# Patient Record
Sex: Female | Born: 1980
Health system: Southern US, Community
[De-identification: ages and names within clinical notes are randomized; demographics above are authoritative.]

## PROBLEM LIST (undated history)

## (undated) DIAGNOSIS — L80 Vitiligo: Secondary | ICD-10-CM

## (undated) DIAGNOSIS — N946 Dysmenorrhea, unspecified: Secondary | ICD-10-CM

## (undated) DIAGNOSIS — M549 Dorsalgia, unspecified: Secondary | ICD-10-CM

## (undated) DIAGNOSIS — M5430 Sciatica, unspecified side: Secondary | ICD-10-CM

## (undated) DIAGNOSIS — F319 Bipolar disorder, unspecified: Secondary | ICD-10-CM

## (undated) DIAGNOSIS — I10 Essential (primary) hypertension: Secondary | ICD-10-CM

## (undated) HISTORY — DX: Essential (primary) hypertension: I10

## (undated) HISTORY — PX: TUBAL LIGATION: SHX77

## (undated) HISTORY — DX: Bipolar disorder, unspecified: F31.9

## (undated) HISTORY — DX: Dorsalgia, unspecified: M54.9

## (undated) HISTORY — DX: Dysmenorrhea, unspecified: N94.6

## (undated) HISTORY — DX: Vitiligo: L80

## (undated) HISTORY — DX: Sciatica, unspecified side: M54.30

---

## 2004-11-13 ENCOUNTER — Emergency Department: Payer: Self-pay | Admitting: Emergency Medicine

## 2005-06-13 ENCOUNTER — Observation Stay: Payer: Self-pay

## 2005-07-30 ENCOUNTER — Inpatient Hospital Stay: Payer: Self-pay

## 2006-02-28 ENCOUNTER — Emergency Department: Payer: Self-pay | Admitting: Emergency Medicine

## 2006-03-05 ENCOUNTER — Emergency Department: Payer: Self-pay | Admitting: Emergency Medicine

## 2006-03-27 ENCOUNTER — Emergency Department: Payer: Self-pay | Admitting: Emergency Medicine

## 2006-04-21 ENCOUNTER — Emergency Department: Payer: Self-pay | Admitting: Emergency Medicine

## 2007-01-23 ENCOUNTER — Emergency Department: Payer: Self-pay | Admitting: Emergency Medicine

## 2007-03-26 ENCOUNTER — Observation Stay: Payer: Self-pay | Admitting: Unknown Physician Specialty

## 2007-03-27 ENCOUNTER — Inpatient Hospital Stay: Payer: Self-pay

## 2008-07-15 ENCOUNTER — Emergency Department: Payer: Self-pay | Admitting: Emergency Medicine

## 2008-07-16 ENCOUNTER — Ambulatory Visit: Payer: Self-pay | Admitting: Family Medicine

## 2009-01-30 ENCOUNTER — Inpatient Hospital Stay: Payer: Self-pay | Admitting: Obstetrics and Gynecology

## 2009-04-06 ENCOUNTER — Encounter: Payer: Self-pay | Admitting: Family Medicine

## 2009-05-10 ENCOUNTER — Encounter: Payer: Self-pay | Admitting: Family Medicine

## 2009-05-21 ENCOUNTER — Emergency Department: Payer: Self-pay | Admitting: Emergency Medicine

## 2009-10-02 ENCOUNTER — Emergency Department: Payer: Self-pay | Admitting: Emergency Medicine

## 2010-11-30 ENCOUNTER — Emergency Department: Payer: Self-pay | Admitting: Internal Medicine

## 2010-12-04 ENCOUNTER — Emergency Department: Payer: Self-pay | Admitting: Emergency Medicine

## 2010-12-14 ENCOUNTER — Emergency Department: Payer: Self-pay | Admitting: Emergency Medicine

## 2012-07-10 ENCOUNTER — Ambulatory Visit: Payer: Self-pay | Admitting: Internal Medicine

## 2012-10-26 ENCOUNTER — Emergency Department: Payer: Self-pay | Admitting: Unknown Physician Specialty

## 2012-10-26 LAB — COMPREHENSIVE METABOLIC PANEL
Albumin: 3.9 g/dL (ref 3.4–5.0)
Alkaline Phosphatase: 65 U/L (ref 50–136)
Anion Gap: 5 — ABNORMAL LOW (ref 7–16)
Bilirubin,Total: 0.1 mg/dL — ABNORMAL LOW (ref 0.2–1.0)
Calcium, Total: 8.8 mg/dL (ref 8.5–10.1)
Chloride: 107 mmol/L (ref 98–107)
Co2: 27 mmol/L (ref 21–32)
EGFR (Non-African Amer.): >60
Glucose: 117 mg/dL — ABNORMAL HIGH (ref 65–99)
Osmolality: 278 (ref 275–301)
SGOT(AST): 20 U/L (ref 15–37)
SGPT (ALT): 20 U/L (ref 12–78)
Total Protein: 7.5 g/dL (ref 6.4–8.2)

## 2012-10-26 LAB — URINALYSIS, COMPLETE
Glucose,UR: NEGATIVE mg/dL (ref 0–75)
Ketone: NEGATIVE
Nitrite: NEGATIVE
Ph: 5 (ref 4.5–8.0)
Protein: NEGATIVE
RBC,UR: 2 /HPF (ref 0–5)
Squamous Epithelial: 4
WBC UR: 2 /HPF (ref 0–5)

## 2012-10-26 LAB — CBC
HGB: 12.4 g/dL (ref 12.0–16.0)
RBC: 4.69 10*6/uL (ref 3.80–5.20)

## 2012-10-30 ENCOUNTER — Ambulatory Visit: Payer: Self-pay | Admitting: Internal Medicine

## 2013-03-11 IMAGING — CR DG LUMBAR SPINE AP/LAT/OBLIQUES W/ FLEX AND EXT
1 series · 6 of 6 positions shown · non-contrast
Comparison: none

REASON FOR EXAM: Low Back Pain
COMMENTS:

[Series 1: ap · 0.17mm/px · 6 of 6 slices shown]
[im 1/6]
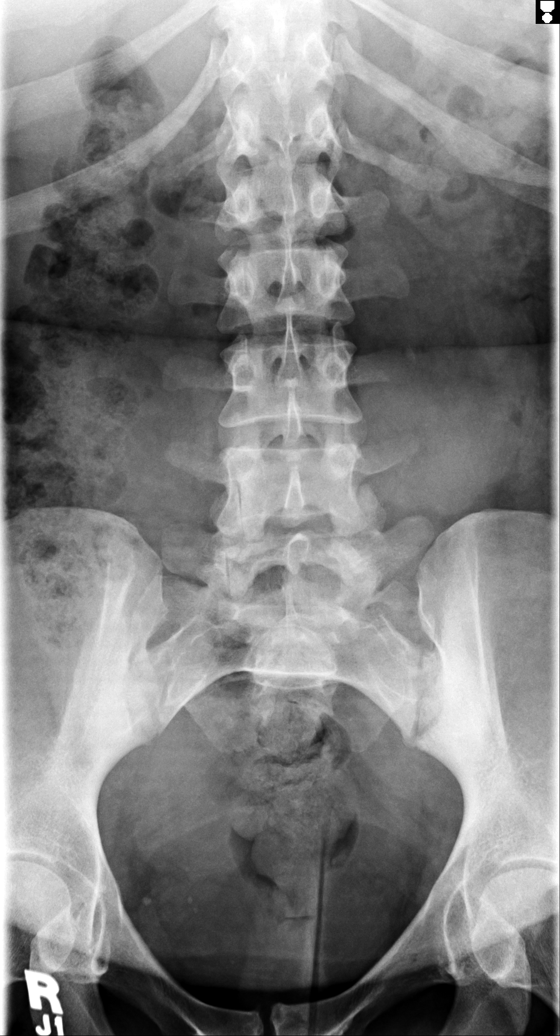
[im 2/6]
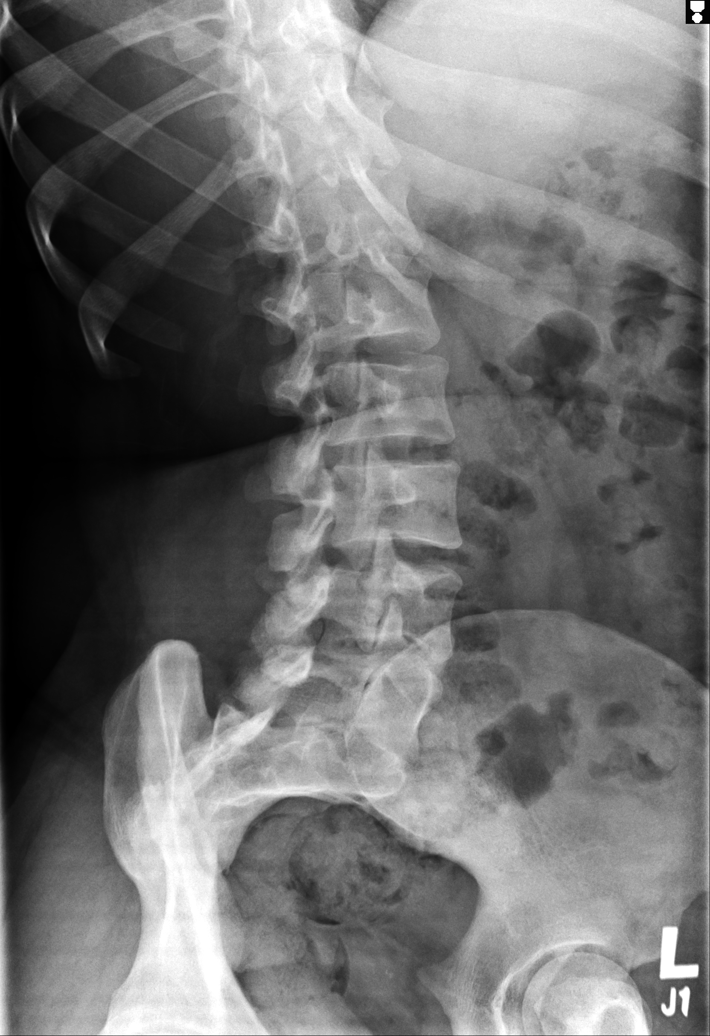
[im 3/6]
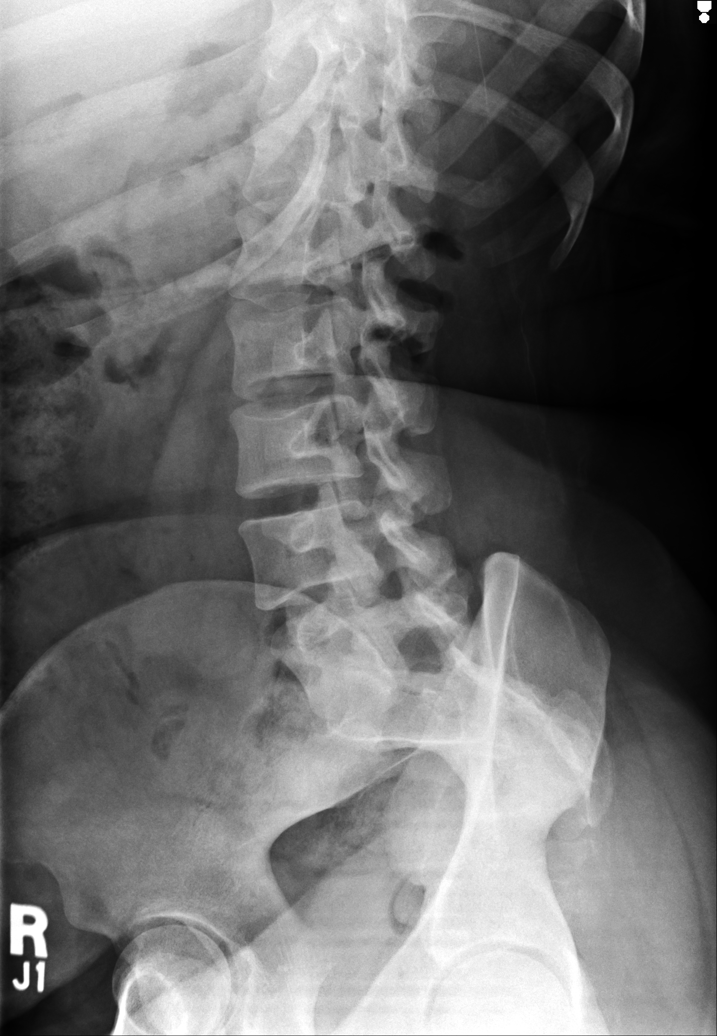
[im 4/6]
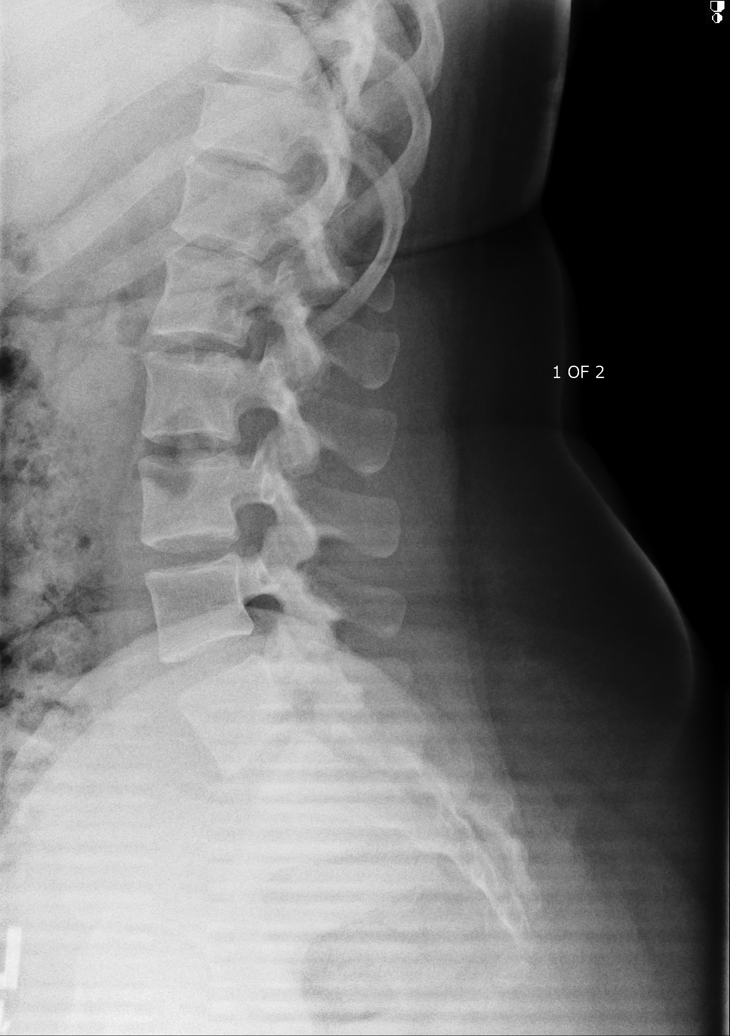
[im 5/6]
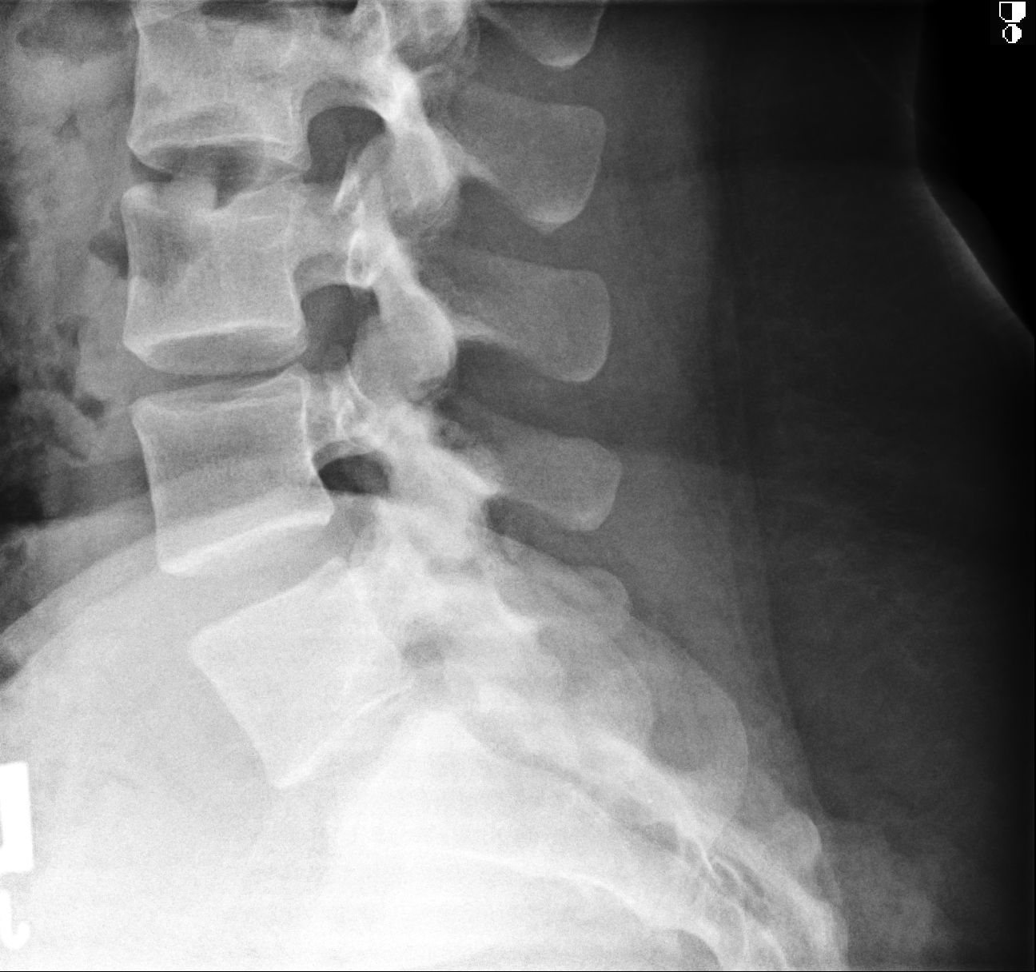
[im 6/6]
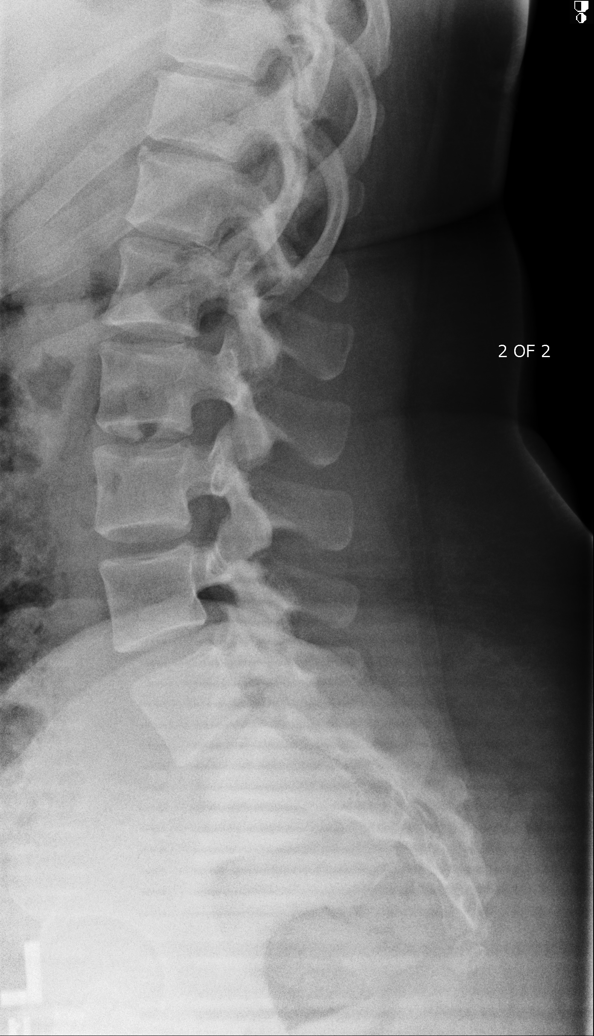

[6 of 6 positions shown; findings below may reference images not displayed]

PROCEDURE:     DXR - DXR LUMBAR SPINE WITH OBLIQUES  - July 10, 2012 [DATE]

RESULT:     No acute abnormality. No evidence of fracture. Stool noted
throughout the colon. Calcification in pelvis most likely phleboliths.
Possible calcific density noted adjacent to the left L3 transverse process.
This may be a small ureteral stone.
IMPRESSION: 1. Cannot a small left ureteral stone. Calcific densities noted adjacent to
the left L3 transverse process. This finding may just represent prominence
of the outer aspect of the transverse process. If ureteral stone is
suspected CT can be performed.
2. No acute bony abnormalities.

## 2014-04-07 DIAGNOSIS — M5430 Sciatica, unspecified side: Secondary | ICD-10-CM | POA: Insufficient documentation

## 2014-04-07 DIAGNOSIS — I10 Essential (primary) hypertension: Secondary | ICD-10-CM | POA: Insufficient documentation

## 2014-04-07 DIAGNOSIS — N946 Dysmenorrhea, unspecified: Secondary | ICD-10-CM | POA: Insufficient documentation

## 2014-04-26 ENCOUNTER — Ambulatory Visit: Payer: Medicare Other | Admitting: Podiatry

## 2014-05-13 ENCOUNTER — Encounter: Payer: Self-pay | Admitting: Podiatry

## 2014-05-26 ENCOUNTER — Ambulatory Visit: Payer: Medicare Other | Admitting: Podiatry

## 2015-04-11 ENCOUNTER — Inpatient Hospital Stay
Admission: EM | Admit: 2015-04-11 | Discharge: 2015-04-12 | DRG: 885 | Disposition: A | Discharge status: Home / Self Care | Payer: Medicare Other | Attending: Psychiatry | Admitting: Psychiatry

## 2015-04-11 ENCOUNTER — Encounter: Payer: Self-pay | Admitting: Emergency Medicine

## 2015-04-11 DIAGNOSIS — F419 Anxiety disorder, unspecified: Secondary | ICD-10-CM | POA: Diagnosis present

## 2015-04-11 DIAGNOSIS — Z833 Family history of diabetes mellitus: Secondary | ICD-10-CM

## 2015-04-11 DIAGNOSIS — F17210 Nicotine dependence, cigarettes, uncomplicated: Secondary | ICD-10-CM | POA: Diagnosis present

## 2015-04-11 DIAGNOSIS — F3112 Bipolar disorder, current episode manic without psychotic features, moderate: Principal | ICD-10-CM | POA: Diagnosis present

## 2015-04-11 DIAGNOSIS — Z8249 Family history of ischemic heart disease and other diseases of the circulatory system: Secondary | ICD-10-CM

## 2015-04-11 DIAGNOSIS — F1290 Cannabis use, unspecified, uncomplicated: Secondary | ICD-10-CM | POA: Diagnosis present

## 2015-04-11 DIAGNOSIS — N946 Dysmenorrhea, unspecified: Secondary | ICD-10-CM | POA: Diagnosis present

## 2015-04-11 DIAGNOSIS — F1210 Cannabis abuse, uncomplicated: Secondary | ICD-10-CM | POA: Diagnosis present

## 2015-04-11 DIAGNOSIS — F329 Major depressive disorder, single episode, unspecified: Secondary | ICD-10-CM

## 2015-04-11 DIAGNOSIS — M5430 Sciatica, unspecified side: Secondary | ICD-10-CM | POA: Diagnosis present

## 2015-04-11 DIAGNOSIS — Z9114 Patient's other noncompliance with medication regimen: Secondary | ICD-10-CM | POA: Diagnosis present

## 2015-04-11 DIAGNOSIS — F17200 Nicotine dependence, unspecified, uncomplicated: Secondary | ICD-10-CM | POA: Diagnosis present

## 2015-04-11 DIAGNOSIS — Z79899 Other long term (current) drug therapy: Secondary | ICD-10-CM

## 2015-04-11 DIAGNOSIS — F314 Bipolar disorder, current episode depressed, severe, without psychotic features: Secondary | ICD-10-CM | POA: Diagnosis present

## 2015-04-11 DIAGNOSIS — I10 Essential (primary) hypertension: Secondary | ICD-10-CM | POA: Diagnosis present

## 2015-04-11 DIAGNOSIS — Z9119 Patient's noncompliance with other medical treatment and regimen: Secondary | ICD-10-CM | POA: Diagnosis present

## 2015-04-11 DIAGNOSIS — Z639 Problem related to primary support group, unspecified: Secondary | ICD-10-CM

## 2015-04-11 DIAGNOSIS — R45851 Suicidal ideations: Secondary | ICD-10-CM | POA: Diagnosis present

## 2015-04-11 LAB — URINE DRUG SCREEN, QUALITATIVE (ARMC ONLY)
AMPHETAMINES, UR SCREEN: NOT DETECTED
Barbiturates, Ur Screen: NOT DETECTED
Benzodiazepine, Ur Scrn: NOT DETECTED
COCAINE METABOLITE, UR ~~LOC~~: NOT DETECTED
Cannabinoid 50 Ng, Ur ~~LOC~~: POSITIVE — AB
MDMA (ECSTASY) UR SCREEN: NOT DETECTED
Methadone Scn, Ur: NOT DETECTED
Opiate, Ur Screen: NOT DETECTED
Phencyclidine (PCP) Ur S: NOT DETECTED
Tricyclic, Ur Screen: NOT DETECTED

## 2015-04-11 LAB — POCT PREGNANCY, URINE: PREG TEST UR: NEGATIVE

## 2015-04-11 LAB — CBC
HEMATOCRIT: 34.2 % — AB (ref 35.0–47.0)
HEMOGLOBIN: 10.8 g/dL — AB (ref 12.0–16.0)
MCH: 24.9 pg — ABNORMAL LOW (ref 26.0–34.0)
MCHC: 31.6 g/dL — ABNORMAL LOW (ref 32.0–36.0)
MCV: 78.8 fL — AB (ref 80.0–100.0)
Platelets: 201 10*3/uL (ref 150–440)
RBC: 4.35 MIL/uL (ref 3.80–5.20)
RDW: 18.2 % — ABNORMAL HIGH (ref 11.5–14.5)
WBC: 8.8 10*3/uL (ref 3.6–11.0)

## 2015-04-11 LAB — COMPREHENSIVE METABOLIC PANEL
ALBUMIN: 4 g/dL (ref 3.5–5.0)
ALK PHOS: 46 U/L (ref 38–126)
ALT: 9 U/L — ABNORMAL LOW (ref 14–54)
AST: 13 U/L — ABNORMAL LOW (ref 15–41)
Anion gap: 7 (ref 5–15)
BILIRUBIN TOTAL: 0.3 mg/dL (ref 0.3–1.2)
BUN: 9 mg/dL (ref 6–20)
CALCIUM: 8.9 mg/dL (ref 8.9–10.3)
CO2: 26 mmol/L (ref 22–32)
Chloride: 106 mmol/L (ref 101–111)
Creatinine, Ser: 0.66 mg/dL (ref 0.44–1.00)
GFR calc non Af Amer: >60 mL/min (ref >60)
Glucose, Bld: 100 mg/dL — ABNORMAL HIGH (ref 65–99)
Potassium: 3.4 mmol/L — ABNORMAL LOW (ref 3.5–5.1)
Sodium: 139 mmol/L (ref 135–145)
TOTAL PROTEIN: 7.5 g/dL (ref 6.5–8.1)

## 2015-04-11 LAB — SALICYLATE LEVEL

## 2015-04-11 LAB — ETHANOL: Alcohol, Ethyl (B): <5 mg/dL (ref <5)

## 2015-04-11 LAB — ACETAMINOPHEN LEVEL

## 2015-04-11 MED ORDER — TRAZODONE HCL 50 MG PO TABS
ORAL_TABLET | ORAL | Status: AC
  Filled 2015-04-11: qty 1

## 2015-04-11 MED ORDER — TRAZODONE HCL 50 MG PO TABS: 50.0000 mg | ORAL_TABLET | Freq: Every day | ORAL | Status: DC

## 2015-04-11 NOTE — ED Notes (Signed)
Pt report received from Ermalene SearingLauren S RN. Pt care assumed. Pt sitting in day room with no needs or concerns at this time.
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
## 2015-04-11 NOTE — ED Notes (Signed)
This RN Lab called multiple times about unresulted drug screen. Lab states that drug screen is resulting, but is "not crossing over to epic". Will continue to follow. Results sent via paper from lab. 

## 2015-04-11 NOTE — ED Notes (Signed)
BEHAVIORAL HEALTH ROUNDING Patient sleeping: no Patient alert and oriented: yes Behavior appropriate: yes Nutrition and fluids offered: yes Toileting and hygiene offered: yes Sitter present: no Law enforcement present: yes 

## 2015-04-11 NOTE — ED Notes (Signed)
Pt tearful in room, speaking in an appropriate manor about denying suicidal ideation. Pt states she did not come here presenting with SI complaints. Pt states "I'll I said what that I wanted to go to sleep and not wake up for a while because I'm tired. I don't want to die". Pt stating that she came here because she feels overwhelmed and stressed out and came here for help, not because she wants to die. Pt asking to speak with hospital's president to express her concern that she has children and we cannot IVC her as her time is important. Pt reassured that her care is being considered all the time and the psychiatrist is coming to see her. Rich, [employer] security present, this RN present.

## 2015-04-11 NOTE — ED Notes (Signed)
Pt suddenly calm in day room watching tv awaiting psychiatrist as he is in another room assessing another pt.

## 2015-04-11 NOTE — ED Notes (Signed)
Copies of labwork/urine drug screen attached to hard copy of chart. MD Goodman made aware and has reviewed drug screen results. Still pending translation into epic. 

## 2015-04-11 NOTE — ED Notes (Signed)
Patient tearful in triage, reports she woke up this morning feeling like she just didn't want to be here anymore, reports she is tired and just wants to end it. Patient reports she has bipolar and has went through this before. The only reason she is here is because of her kids.

## 2015-04-11 NOTE — ED Provider Notes (Signed)
[facility] Emergency Department Provider Note  ____________________________________________  Time seen: 8:10 AM  I have reviewed the triage vital signs and the nursing notes.   HISTORY  Chief Complaint Suicidal  HPI Anna Harris is a 34 y.o. female who has a history of bipolar depression, and has been off of her medications. She reports "I do not trust Western medicine".She reports daily suicidal ideation, with a vague plan. She reports that her husband has been sick recently but refuses to see a doctor, and this is making her very stressed. Every day when she drops her kids off at school, she just wants to die, and the only thing stopping her is knowing that her kids will need to be picked up at the end of the school day. She denies any homicidal ideation. She reports hearing voices, but cannot describe them very well. She does report being paranoid, and is afraid of taking medications. She notes that she recently discovered through online research on the Internet that medications cause calcium deposits in the body, and she has calcium deposits very close to her spine that were seen on an x-ray, and she is very concerned about this. She denies any specific pain, chest pain, shortness of breath, fever, chills, nausea, vomiting vomiting, diarrhea, abdominal pain, dizziness.     Past Medical History  Diagnosis Date  . Dysmenorrhea   . Hypertension   . Sciatic pain   . Back pain   . Bipolar 1 disorder     There are no active problems to display for this patient.   Past Surgical History  Procedure Laterality Date  . Cesarean section      Current Outpatient Rx  Name  Route  Sig  Dispense  Refill  . Asenapine Maleate (SAPHRIS) 10 MG SUBL   Sublingual   Place under the tongue.         . cyclobenzaprine (FLEXERIL) 5 MG tablet   Oral   Take 5 mg by mouth 3 (three) times daily as needed for muscle spasms.         . DULoxetine (CYMBALTA) 30 MG  capsule   Oral   Take 30 mg by mouth daily.         Marland Kitchen. lisinopril (PRINIVIL,ZESTRIL) 20 MG tablet   Oral   Take 20 mg by mouth daily.         . traZODone (DESYREL) 50 MG tablet   Oral   Take 50 mg by mouth at bedtime.           Allergies Sulfa antibiotics  Family History  Problem Relation Age of Onset  . Diabetes Father     Social History History  Substance Use Topics  . Smoking status: Current Every Day Smoker  . Smokeless tobacco: Never Used  . Alcohol Use: No    Review of Systems  Constitutional: Negative for fever. Eyes: Negative for visual changes. ENT: Negative for sore throat. Cardiovascular: Negative for chest pain. Respiratory: Negative for shortness of breath. Gastrointestinal: Negative for abdominal pain, vomiting and diarrhea. Genitourinary: Negative for dysuria. Musculoskeletal: Negative for back pain. Skin: Negative for rash. Neurological: Negative for headaches, focal weakness or numbness.   10-point ROS otherwise negative.  ____________________________________________   PHYSICAL EXAM:  VITAL SIGNS: ED Triage Vitals  Enc Vitals Group     BP 04/11/15 0755 200/133 mmHg     Pulse Rate 04/11/15 0755 66     Resp 04/11/15 0755 18     Temp 04/11/15 0755  98.2 F (36.8 C)     Temp Source 04/11/15 0755 Oral     SpO2 04/11/15 0755 100 %     Weight 04/11/15 0755 180 lb (81.647 kg)     Height 04/11/15 0755  (1.651 m)     Head Cir --      Peak Flow --      Pain Score --      Pain Loc --      Pain Edu? --      Excl. in GC? --      Constitutional: Alert and oriented. Well appearing and in no distress. Eyes: Conjunctivae are normal. PERRL. Normal extraocular movements. ENT   Head: Normocephalic and atraumatic.   Nose: No congestion/rhinnorhea.   Mouth/Throat: Mucous membranes are moist.   Neck: No stridor. Hematological/Lymphatic/Immunilogical: No cervical lymphadenopathy. Cardiovascular: Normal rate, regular rhythm.  Normal and symmetric distal pulses are present in all extremities. No murmurs, rubs, or gallops. Respiratory: Normal respiratory effort without tachypnea nor retractions. Breath sounds are clear and equal bilaterally. No wheezes/rales/rhonchi. Gastrointestinal: Soft and nontender. No distention. No abdominal bruits. There is no CVA tenderness. Genitourinary: deferred Musculoskeletal: Nontender with normal range of motion in all extremities. No joint effusions.  No lower extremity tenderness nor edema. Neurologic:  Normal speech and language. No gross focal neurologic deficits are appreciated. Speech is normal. No gait instability. Skin:  Skin is warm, dry and intact. No rash noted. Psychiatric: Mood and affect are normal. Speech and behavior are normal. Patient exhibits appropriate insight and judgment.  ____________________________________________   ____________________________________________   PROCEDURES  Procedure(s) performed: None  Critical Care performed: No  ____________________________________________   INITIAL IMPRESSION / ASSESSMENT AND PLAN / ED COURSE  Pertinent labs & imaging results that were available during my care of the patient were reviewed by me and considered in my medical decision making (see chart for details).  Ms. Otterson appears depressed, being tearful on examination and suicidal. Due to her noncompliance with medications for a history of bipolar disorder and her current presentation, I'm going to place her on involuntary commitment while he obtain a psychiatric consult. Her blood pressure elevation is most likely chronic, as she reports that she is supposed to be on atenolol but does not take it due to her distrust of medicine in general. There is no evidence of acute medical complications of her chronic hypertension. She does not have any other acute medical complaints, and appears to be medically stable at this time, pending psychiatric  evaluation.  Results for orders placed or performed during the hospital encounter of 04/11/15  Acetaminophen level  Result Value Ref Range   Acetaminophen (Tylenol), Serum <10 (L) 10 - 30 ug/mL  CBC  Result Value Ref Range   WBC 8.8 3.6 - 11.0 K/uL   RBC 4.35 3.80 - 5.20 MIL/uL   Hemoglobin 10.8 (L) 12.0 - 16.0 g/dL   HCT 16.1 (L) 09.6 - 04.5 %   MCV 78.8 (L) 80.0 - 100.0 fL   MCH 24.9 (L) 26.0 - 34.0 pg   MCHC 31.6 (L) 32.0 - 36.0 g/dL   RDW 40.9 (H) 81.1 - 91.4 %   Platelets 201 150 - 440 K/uL  Comprehensive metabolic panel  Result Value Ref Range   Sodium 139 135 - 145 mmol/L   Potassium 3.4 (L) 3.5 - 5.1 mmol/L   Chloride 106 101 - 111 mmol/L   CO2 26 22 - 32 mmol/L   Glucose, Bld 100 (H) 65 - 99 mg/dL  BUN 9 6 - 20 mg/dL   Creatinine, Ser 1.61 0.44 - 1.00 mg/dL   Calcium 8.9 8.9 - 09.6 mg/dL   Total Protein 7.5 6.5 - 8.1 g/dL   Albumin 4.0 3.5 - 5.0 g/dL   AST 13 (L) 15 - 41 U/L   ALT 9 (L) 14 - 54 U/L   Alkaline Phosphatase 46 38 - 126 U/L   Total Bilirubin 0.3 0.3 - 1.2 mg/dL   GFR calc non Af Amer >60 >60 mL/min   GFR calc Af Amer >60 >60 mL/min   Anion gap 7 5 - 15  Ethanol (ETOH)  Result Value Ref Range   Alcohol, Ethyl (B) <5 <5 mg/dL  Salicylate level  Result Value Ref Range   Salicylate Lvl <4.0 2.8 - 30.0 mg/dL  Pregnancy, urine POC  Result Value Ref Range   Preg Test, Ur NEGATIVE NEGATIVE   ----------------------------------------- 10:56 AM on 04/11/2015 -----------------------------------------  The patient remains medically stable. Her labs are unremarkable. No evidence of acute toxicity syndrome. We will continue to hold her in the emergency department under IVC pending psychiatric evaluation. ____________________________________________   FINAL CLINICAL IMPRESSION(S) / ED DIAGNOSES  Suicidal ideation, acute, initial encounter Depression   Sharman Cheek, MD 04/11/15 1057

## 2015-04-11 NOTE — ED Notes (Signed)
ED BHU PLACEMENT JUSTIFICATION Is the patient under IVC or is there intent for IVC: yes Is the patient medically cleared: yes Is there vacancy in the ED BHU: yes Is the population mix appropriate for patient: yes Is the patient awaiting placement in inpatient or outpatient setting: yes Has the patient had a psychiatric consult: no Survey of unit performed for contraband, proper placement and condition of furniture, tampering with fixtures in bathroom, shower, and each patient room: yes, no findings APPEARANCE/BEHAVIOR Calm, cooperative NEURO ASSESSMENT Orientation: alert and oriented x 3 Hallucinations: no Speech: cleer, normal Gait: steady RESPIRATORY ASSESSMENT No resp distress CARDIOVASCULAR ASSESSMENT Regular rate GASTROINTESTINAL ASSESSMENT wnl EXTREMITIES wnl PLAN OF CARE Provide calm/safe environment. Vital signs assessed twice daily. ED BHU Assessment once each 12-hour shift. Collaborate with intake RN daily or as condition indicates. Assure the ED provider has rounded once each shift. Provide and encourage hygiene. Provide redirection as needed. Assess for escalating behavior; address immediately and inform ED provider.  Assess family dynamic and appropriateness for visitation as needed: yes Educate the patient/family about BHU procedures/visitation: yes

## 2015-04-11 NOTE — ED Notes (Signed)
Patient assigned to appropriate care area. Patient oriented to unit/care area: Informed that, for their safety, care areas are designed for safety and monitored by security cameras at all times; and visiting hours explained to patient. Patient verbalizes understanding, and verbal contract for safety obtained. 

## 2015-04-11 NOTE — ED Notes (Signed)
ENVIRONMENTAL ASSESSMENT Potentially harmful objects out of patient reach: yes Personal belongings secured: yes Patient dressed in hospital provided attire only: yes Plastic bags out of patient reach: yes Patient care equipment (cords, cables, call bells, lines, and drains) shortened, removed, or accounted for: none present Equipment and supplies removed from bottom of stretcher: n/a no stretcher  Potentially toxic materials out of patient reach: yes Sharps container removed or out of patient reach: yes 

## 2015-04-11 NOTE — Consult Note (Signed)
[facility] Consult   Reason for Consult:  Patient with history bipolar disorder presents agitated and possible suicidal ideation Referring Physician: ER Patient Identification: Anna Harris MRN:  [id number] Principal Diagnosis: <principal problem not specified> Diagnosis:   Patient Active Problem List   Diagnosis Date Noted  . Bipolar 1 disorder with moderate mania [F31.12] 04/11/2015    Total Time spent with patient: 1 hour  Subjective:   Anna Harris is a 34 y.o. female patient admitted with suicidal thoughts and agitation and thought disorder.  HPI:  Patient reports she had a "bad morning" and was feeling agitated and expressed some SI and hopelessness. Has been noncompliant with medication and outpt treatment. Using MJ daily. Under a lot of stress from care of sick husband HPI Elements:   Quality:  depressed and anxious. Severity:  severe. Timing:  much worse today. Duration:  weeks. Context:  finantial and psychosocial stress at home.  Past Medical History:  Past Medical History  Diagnosis Date  . Dysmenorrhea   . Hypertension   . Sciatic pain   . Back pain   . Bipolar 1 disorder     Past Surgical History  Procedure Laterality Date  . Cesarean section     Family History:  Family History  Problem Relation Age of Onset  . Diabetes Father    Social History:  History  Alcohol Use No     History  Drug Use  . Yes  . Special: Marijuana    History   Social History  . Marital Status: Married    Spouse Name: N/A  . Number of Children: N/A  . Years of Education: N/A   Social History Main Topics  . Smoking status: Current Every Day Smoker  . Smokeless tobacco: Never Used  . Alcohol Use: No  . Drug Use: Yes    Special: Marijuana  . Sexual Activity: Not on file   Other Topics Concern  . None   Social History Narrative   Additional Social History:    Pain Medications: none Prescriptions: none Over the Counter: none History of  alcohol / drug use?: No history of alcohol / drug abuse                     Allergies:   Allergies  Allergen Reactions  . Sulfa Antibiotics Swelling    Labs:  Results for orders placed or performed during the hospital encounter of 04/11/15 (from the past 48 hour(s))  Acetaminophen level     Status: Abnormal   Collection Time: 04/11/15  7:41 AM  Result Value Ref Range   Acetaminophen (Tylenol), Serum <10 (L) 10 - 30 ug/mL    Comment:        THERAPEUTIC CONCENTRATIONS VARY SIGNIFICANTLY. A RANGE OF 10-30 ug/mL MAY BE AN EFFECTIVE CONCENTRATION FOR MANY PATIENTS. HOWEVER, SOME ARE BEST TREATED AT CONCENTRATIONS OUTSIDE THIS RANGE. ACETAMINOPHEN CONCENTRATIONS >150 ug/mL AT 4 HOURS AFTER INGESTION AND >50 ug/mL AT 12 HOURS AFTER INGESTION ARE OFTEN ASSOCIATED WITH TOXIC REACTIONS.   CBC     Status: Abnormal   Collection Time: 04/11/15  7:41 AM  Result Value Ref Range   WBC 8.8 3.6 - 11.0 K/uL   RBC 4.35 3.80 - 5.20 MIL/uL   Hemoglobin 10.8 (L) 12.0 - 16.0 g/dL   HCT 34.2 (L) 35.0 - 47.0 %   MCV 78.8 (L) 80.0 - 100.0 fL   MCH 24.9 (L) 26.0 - 34.0 pg   MCHC 31.6 (L) 32.0 -  36.0 g/dL   RDW 18.2 (H) 11.5 - 14.5 %   Platelets 201 150 - 440 K/uL  Comprehensive metabolic panel     Status: Abnormal   Collection Time: 04/11/15  7:41 AM  Result Value Ref Range   Sodium 139 135 - 145 mmol/L   Potassium 3.4 (L) 3.5 - 5.1 mmol/L   Chloride 106 101 - 111 mmol/L   CO2 26 22 - 32 mmol/L   Glucose, Bld 100 (H) 65 - 99 mg/dL   BUN 9 6 - 20 mg/dL   Creatinine, Ser 0.66 0.44 - 1.00 mg/dL   Calcium 8.9 8.9 - 10.3 mg/dL   Total Protein 7.5 6.5 - 8.1 g/dL   Albumin 4.0 3.5 - 5.0 g/dL   AST 13 (L) 15 - 41 U/L   ALT 9 (L) 14 - 54 U/L   Alkaline Phosphatase 46 38 - 126 U/L   Total Bilirubin 0.3 0.3 - 1.2 mg/dL   GFR calc non Af Amer >60 >60 mL/min   GFR calc Af Amer >60 >60 mL/min    Comment: (NOTE) The eGFR has been calculated using the CKD EPI equation. This calculation has  not been validated in all clinical situations. eGFR's persistently <90 mL/min signify possible Chronic Kidney Disease.    Anion gap 7 5 - 15  Ethanol (ETOH)     Status: None   Collection Time: 04/11/15  7:41 AM  Result Value Ref Range   Alcohol, Ethyl (B) <5 <5 mg/dL    Comment:        LOWEST DETECTABLE LIMIT FOR SERUM ALCOHOL IS 11 mg/dL FOR MEDICAL PURPOSES ONLY   Salicylate level     Status: None   Collection Time: 04/11/15  7:41 AM  Result Value Ref Range   Salicylate Lvl <9.7 2.8 - 30.0 mg/dL  Pregnancy, urine POC     Status: None   Collection Time: 04/11/15  9:10 AM  Result Value Ref Range   Preg Test, Ur NEGATIVE NEGATIVE    Comment:        THE SENSITIVITY OF THIS METHODOLOGY IS >24 mIU/mL     Vitals: Blood pressure 137/114, pulse 66, temperature 98.2 F (36.8 C), temperature source Oral, resp. rate 18, height _0  (1.651 m), weight 81.647 kg (180 lb), last menstrual period 04/11/2015, SpO2 100 %.  Risk to Self: Suicidal Ideation: Yes-Currently Present Suicidal Intent: Yes-Currently Present Is patient at risk for suicide?: Yes Suicidal Plan?: No Access to Means: No How many times?: 0 Risk to Others: Homicidal Ideation: No Thoughts of Harm to Others: No Current Homicidal Intent: No Current Homicidal Plan: No Access to Homicidal Means: No Assessment of Violence: None Noted Does patient have access to weapons?: No Criminal Charges Pending?: No Does patient have a court date: No Prior Inpatient Therapy: Prior Inpatient Therapy: No Prior Outpatient Therapy: Prior Outpatient Therapy: Yes Prior Therapy Dates: CURRENT Prior Therapy Facilty/Provider(s): [facility] Reason for Treatment: H/O BIPOLAR; HAS NOT HAS MEDS IN 6 MONTHS; REFUSES TO TAKE PILLS Does patient have an ACCT team?: No Does patient have Intensive In-House Services?  : No Does patient have [facility] services? : No Does patient have P4CC services?: Unknown  Current Facility-Administered Medications   Medication Dose Route Frequency Provider Last Rate Last Dose  . traZODone (DESYREL) tablet 50 mg  50 mg Oral QHS Carrie Mew, MD       Current Outpatient Prescriptions  Medication Sig Dispense Refill  . cyclobenzaprine (FLEXERIL) 5 MG tablet Take 5 mg by mouth  3 (three) times daily as needed for muscle spasms.    Marland Kitchen ibuprofen (ADVIL,MOTRIN) 800 MG tablet Take 800 mg by mouth 2 (two) times daily as needed for mild pain.    . traZODone (DESYREL) 50 MG tablet Take 50 mg by mouth at bedtime as needed for sleep.       Musculoskeletal: Strength & Muscle Tone: within normal limits Gait & Station: normal Patient leans: N/A  Psychiatric Specialty Exam: Physical Exam  Constitutional: She appears well-developed and well-nourished.  HENT:  Head: Normocephalic.  Eyes: Pupils are equal, round, and reactive to light.  Neck: Normal range of motion.  Psychiatric: Her mood appears anxious. Her affect is blunt. Her speech is rapid and/or pressured. She is agitated. Thought content is paranoid. Cognition and memory are impaired. She expresses impulsivity and inappropriate judgment. She expresses suicidal ideation.    Review of Systems  Constitutional: Negative.   HENT: Negative.   Eyes: Negative.   Respiratory: Negative.   Cardiovascular: Negative.   Skin: Negative.   Psychiatric/Behavioral: Positive for depression and suicidal ideas. The patient is nervous/anxious.     Blood pressure 137/114, pulse 66, temperature 98.2 F (36.8 C), temperature source Oral, resp. rate 18, height _0  (1.651 m), weight 81.647 kg (180 lb), last menstrual period 04/11/2015, SpO2 100 %.Body mass index is 29.95 kg/(m^2).  General Appearance: Disheveled  Eye Contact::  Good  Speech:  Pressured  Volume:  Increased  Mood:  Anxious  Affect:  Labile  Thought Process:  Disorganized  Orientation:  Full (Time, Place, and Person)  Thought Content:  Paranoid Ideation  Suicidal Thoughts:  No  Homicidal Thoughts:  No   Memory:  Recent;   Fair  Judgement:  Fair  Insight:  Fair  Psychomotor Activity:  Normal  Concentration:  Good  Recall:  Good  Fund of Knowledge:Good  Language: Good  Akathisia:  No  Handed:  Right  AIMS (if indicated):     Assets:  Desire for Improvement  ADL's:  Intact  Cognition: WNL  Sleep:      Medical Decision Making: Review of Psycho-Social Stressors (1), Review or order clinical lab tests (1), Established Problem, Worsening (2) and Review of Medication Regimen & Side Effects (2)  Treatment Plan Summary: Plan admit to psychiatry  Plan:  Recommend psychiatric Inpatient admission when medically cleared. Disposition: admit to psychiatry  Alethia Berthold 04/11/2015 9:19 PM

## 2015-04-11 NOTE — ED Notes (Signed)
Pt up to the door while on the phone with someone from [facility], stating we do not treat her properly. Pt informed about social worker's name who assessed her as requested by pt.

## 2015-04-11 NOTE — ED Notes (Signed)
Pt informed she was IVC, not happy with situation

## 2015-04-11 NOTE — ED Notes (Signed)
1730 pt refused vital signs.  Nurse notified.

## 2015-04-11 NOTE — ED Notes (Signed)
ENVIRONMENTAL ASSESSMENT Potentially harmful objects out of patient reach: yes Personal belongings secured: yes Patient dressed in hospital provided attire only: yes Plastic bags out of patient reach: yes Patient care equipment (cords, cables, call bells, lines, and drains) shortened, removed, or accounted for: none present Equipment and supplies removed from bottom of stretcher: n/a, no stretcher Potentially toxic materials out of patient reach: yes Sharps container removed or out of patient reach: yes 

## 2015-04-11 NOTE — ED Notes (Signed)
BEHAVIORAL HEALTH ROUNDING Patient sleeping: no Patient alert and oriented: yes Behavior appropriate: yes Nutrition and fluids offered: yes Toileting and hygiene offered: yes Sitter present: no Law enforcement present: yes, ODS 

## 2015-04-11 NOTE — ED Notes (Addendum)
Pt up to the door many times to complain about her care here. Pt requested her BP to be taken because she thinks that she is going to have a stroke from high blood pressure. Pt refused BP to be taken by Walt DisneyBenita, tech. Pt accepts vitals to be taken by this RN. Pt asking to use the phone many times to call her husband, stating each call will not go through. Pt assisted by this RN to make a phone call. Pt requesting a pen and paper to start writing her complaint letter to the hospital about her care here. Pt states that we are treating her like animals locked up. Pt informed that the MD deemed her a risk to herself and have committed her here for her safety. Pt denying SI at this time and denies any previous statement made here in the ER. Pt reports that she is going to call the supervisor and is going to call the news about her care, states the techs do not seem concerned about her life. Pt again made aware that she is here for her safety and says "well if I actually wanted to die then I would have jumped off the bridge and not come here. Nobody should come here for this". Pt given her phone to call her husband again and walks off briskly to her room. Pt somewhat manic.

## 2015-04-11 NOTE — ED Notes (Signed)
Dr. Toni Amendlapacs in room to assess pt. Pt calm and cooperative at this time.

## 2015-04-11 NOTE — ED Notes (Signed)
BEHAVIORAL HEALTH ROUNDING Patient sleeping: No. Patient alert and oriented: yes Behavior appropriate: Yes.  ; If no, describe:  Nutrition and fluids offered: Yes  Toileting and hygiene offered: Yes  Sitter present: no Law enforcement present: Yes BEHAVIORAL HEALTH ROUNDING Patient sleeping: No. Patient alert and oriented: yes Behavior appropriate: Yes.  ; If no, describe:  Nutrition and fluids offered: Yes  Toileting and hygiene offered: Yes  Sitter present: no Law enforcement present: Yes  

## 2015-04-11 NOTE — ED Notes (Signed)
BEHAVIORAL HEALTH ROUNDING Patient sleeping: No. Patient alert and oriented: yes Behavior appropriate: Yes.  ; If no, describe:  Nutrition and fluids offered: Yes  and declined Toileting and hygiene offered: Yes Sitter present: no Law enforcement present: Yes  and [employer]

## 2015-04-11 NOTE — ED Notes (Signed)

## 2015-04-12 ENCOUNTER — Encounter: Payer: Self-pay | Admitting: Psychiatry

## 2015-04-12 DIAGNOSIS — F3112 Bipolar disorder, current episode manic without psychotic features, moderate: Secondary | ICD-10-CM | POA: Diagnosis present

## 2015-04-12 DIAGNOSIS — N946 Dysmenorrhea, unspecified: Secondary | ICD-10-CM | POA: Diagnosis present

## 2015-04-12 DIAGNOSIS — F1210 Cannabis abuse, uncomplicated: Secondary | ICD-10-CM | POA: Diagnosis present

## 2015-04-12 DIAGNOSIS — Z9114 Patient's other noncompliance with medication regimen: Secondary | ICD-10-CM | POA: Diagnosis present

## 2015-04-12 DIAGNOSIS — M5430 Sciatica, unspecified side: Secondary | ICD-10-CM | POA: Diagnosis present

## 2015-04-12 DIAGNOSIS — Z9119 Patient's noncompliance with other medical treatment and regimen: Secondary | ICD-10-CM | POA: Diagnosis present

## 2015-04-12 DIAGNOSIS — F419 Anxiety disorder, unspecified: Secondary | ICD-10-CM | POA: Diagnosis present

## 2015-04-12 DIAGNOSIS — F17210 Nicotine dependence, cigarettes, uncomplicated: Secondary | ICD-10-CM | POA: Diagnosis present

## 2015-04-12 DIAGNOSIS — Z79899 Other long term (current) drug therapy: Secondary | ICD-10-CM | POA: Diagnosis not present

## 2015-04-12 DIAGNOSIS — Z8249 Family history of ischemic heart disease and other diseases of the circulatory system: Secondary | ICD-10-CM | POA: Diagnosis not present

## 2015-04-12 DIAGNOSIS — R45851 Suicidal ideations: Secondary | ICD-10-CM | POA: Diagnosis present

## 2015-04-12 DIAGNOSIS — Z639 Problem related to primary support group, unspecified: Secondary | ICD-10-CM | POA: Diagnosis not present

## 2015-04-12 DIAGNOSIS — F1290 Cannabis use, unspecified, uncomplicated: Secondary | ICD-10-CM | POA: Diagnosis present

## 2015-04-12 DIAGNOSIS — I10 Essential (primary) hypertension: Secondary | ICD-10-CM | POA: Diagnosis present

## 2015-04-12 DIAGNOSIS — Z833 Family history of diabetes mellitus: Secondary | ICD-10-CM | POA: Diagnosis not present

## 2015-04-12 DIAGNOSIS — F17200 Nicotine dependence, unspecified, uncomplicated: Secondary | ICD-10-CM | POA: Diagnosis present

## 2015-04-12 MED ORDER — CLONIDINE HCL 0.1 MG PO TABS
0.1000 mg | ORAL_TABLET | Freq: Once | ORAL | Status: AC
  Administered 2015-04-12: 0.1 mg via ORAL
  Filled 2015-04-12: qty 1

## 2015-04-12 MED ORDER — NICOTINE 14 MG/24HR TD PT24
14.0000 mg | MEDICATED_PATCH | Freq: Every day | TRANSDERMAL | Status: DC
  Administered 2015-04-12: 14 mg via TRANSDERMAL
  Filled 2015-04-12: qty 1

## 2015-04-12 MED ORDER — IBUPROFEN 800 MG PO TABS
ORAL_TABLET | ORAL | Status: AC
  Filled 2015-04-12: qty 1

## 2015-04-12 MED ORDER — MAGNESIUM HYDROXIDE 400 MG/5ML PO SUSP: 30.0000 mL | Freq: Every day | ORAL | Status: DC | PRN

## 2015-04-12 MED ORDER — CYCLOBENZAPRINE HCL 10 MG PO TABS: 5.0000 mg | ORAL_TABLET | Freq: Three times a day (TID) | ORAL | Status: DC | PRN

## 2015-04-12 MED ORDER — IBUPROFEN 800 MG PO TABS: 800.0000 mg | ORAL_TABLET | Freq: Two times a day (BID) | ORAL | Status: DC | PRN

## 2015-04-12 MED ORDER — ACETAMINOPHEN 325 MG PO TABS: 650.0000 mg | ORAL_TABLET | Freq: Four times a day (QID) | ORAL | Status: DC | PRN

## 2015-04-12 MED ORDER — ALUM & MAG HYDROXIDE-SIMETH 200-200-20 MG/5ML PO SUSP
30.0000 mL | ORAL | Status: DC | PRN
  Filled 2015-04-12: qty 30

## 2015-04-12 MED ORDER — IBUPROFEN 800 MG PO TABS
800.0000 mg | ORAL_TABLET | Freq: Once | ORAL | Status: AC
  Administered 2015-04-12: 800 mg via ORAL

## 2015-04-12 MED ORDER — NICOTINE 10 MG IN INHA
1.0000 | RESPIRATORY_TRACT | Status: DC | PRN
  Filled 2015-04-12: qty 36

## 2015-04-12 MED ORDER — CLONIDINE HCL 0.1 MG PO TABS: 0.1000 mg | ORAL_TABLET | Freq: Three times a day (TID) | ORAL | Status: DC

## 2015-04-12 MED ORDER — CLONIDINE HCL 0.1 MG PO TABS
0.1000 mg | ORAL_TABLET | Freq: Three times a day (TID) | ORAL | Status: DC | PRN
  Administered 2015-04-12: 0.1 mg via ORAL
  Filled 2015-04-12: qty 1

## 2015-04-12 NOTE — ED Notes (Signed)
BEHAVIORAL HEALTH ROUNDING Patient sleeping: Yes.   Patient alert and oriented: asleep Behavior appropriate: Yes.  ; If no, describe:  Nutrition and fluids offered: asleep Toileting and hygiene offered: asleep Sitter present: no Law enforcement present: yes, ODS 

## 2015-04-12 NOTE — ED Notes (Signed)
BEHAVIORAL HEALTH ROUNDING Patient sleeping: No. Patient alert and oriented: yes Behavior appropriate: Yes.  ; If no, describe:  Nutrition and fluids offered: Yes  Toileting and hygiene offered: Yes  Sitter present: no Law enforcement present: Yes, ODS 

## 2015-04-12 NOTE — Progress Notes (Signed)
Pt status is discharged prior to medicine consult being completed.  We apologize for any inconvenience in the delay of this consult completion and hope that you continue to call us for any assistance you might need.  Kristeen MissWILLIS, Vali Capano [facility]ING [facility] 04/12/2015, 8:23 PM

## 2015-04-12 NOTE — ED Notes (Signed)
BEHAVIORAL HEALTH ROUNDING Patient sleeping: Yes.   Patient alert and oriented: asleep Behavior appropriate: Yes.  ; If no, describe:  Nutrition and fluids offered: asleep Toileting and hygiene offered: asleep Sitter present: no Law enforcement present: Yes, ODS 

## 2015-04-12 NOTE — H&P (Signed)
Psychiatric Admission Assessment Adult  Patient Identification: Anna Harris MRN:  [id number] Date of Evaluation:  04/12/2015 Chief Complaint:  altered mental status Principal Diagnosis: Bipolar 1 disorder, depressed, severe Diagnosis:   Patient Active Problem List   Diagnosis Date Noted  . Bipolar 1 disorder, manic, moderate [F31.12] 04/12/2015  . Tobacco use disorder [Z72.0] 04/12/2015  . Accelerated hypertension [I10] 04/12/2015  . Bipolar 1 disorder, depressed, severe [F31.4] 04/11/2015   History of Present Illness::  Anna Harris is a 34 year old female with a history of bipolar disorder.  CHEF COMPLAINT: "I was very upset and felt suicidal ."  HISTORY OF PRESENT ILLNESS: Anna Harris has a long history of bipolar illness. She was diagnosed in 2007. She has been on medications for several years but for the past 7 months she has not taken any. She discontinued Saphris because she felt that alternative ways to treat mental illness are more appropriate for her. She no longer takes Saphris. She has been working with community support team and she finds it very helpful. She is trying to research alternative treatments for her illness. This includes hypertension. The patient came to the hospital feeling upset and suicidal stating that she wants to be put to sleep for 10 years in the context of family problems. Her husband of 16 years has been not feeling well for the past year, losing weight, complaining of abdominal pain, unable to eat, missing work and refusing to see a doctor. The patient feels that her husband is severely ill and has been extremely worried about him. She came to the hospital as she felt desperate. She dropped her chioldren at school and hurried to the hospital crying. She reports passing thoughts of suicide. She has not been homicidal. She denies psychotic symptoms. There are no symptoms suggestive of bipolar mania. She does not use alcohol or heavy drugs. She smokes  cannabis.Lately, she has been increasingly depressed with poor sleep and decreased appetite, anhedonia, feelings of guilt and hopelessness and worthlessness, poor energy and concentration, social isolation, and crying status. She has not been able to get out of bed on some days, she has not been able to take care of her 3 small children at times. Her anxiety has been very high.   PAST PSYChIATRIC HISTORY: Anna Harris has never been hospitalized before. She never attempted suicide although when he she will use. Anna Harris has undergone. She reports that she was a wild child. She reports no physical sexual or emotional abuse.  FAMILY PSYCHIATRIC HISTORY: None reported.  MEDICATIONS ON ADMISSION: Flexeril 5 mg 3 times daily, ibuprofen 800 mg 34 times daily as needed for pain.  SOCIAL HISTORY: She grew up in [facility] [place] [facility]. She dropped out of high school in the 10th grade. 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 She has 3 boys ages 42, 42, and 55. 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 She lives with her husband, children and her parents. She is disabled from mental illness. She has Medicaid. She is a smoker.  Total Time spent with patient: 1 hour  Past Medical History:  Past Medical History  Diagnosis Date  . Dysmenorrhea   . Hypertension   . Sciatic pain   . Back pain   . Bipolar 1 disorder     Past Surgical History  Procedure Laterality Date  . Cesarean section     Family History:  Family History  Problem Relation  Age of Onset  . Diabetes Father   . Hypertension Father   . Hypertension Mother    Social History:  History  Alcohol Use No     History  Drug Use  . Yes  . Special: Marijuana    History   Social History  . Marital Status: Married    Spouse Name: N/A  . Number of Children: N/A  . Years of Education: N/A   Social History Main Topics  . Smoking status: Current Every Day Smoker    Types: Cigarettes  .  Smokeless tobacco: Never Used  . Alcohol Use: No  . Drug Use: Yes    Special: Marijuana  . Sexual Activity: Yes    Birth Control/ Protection: None   Other Topics Concern  . None   Social History Narrative   Additional Social History:    Pain Medications: none Prescriptions: none Over the Counter: none History of alcohol / drug use?: No history of alcohol / drug abuse                     Musculoskeletal: Strength & Muscle Tone: within normal limits Gait & Station: normal Patient leans: N/A  Psychiatric Specialty Exam: Physical Exam  Constitutional: She is oriented to person, place, and time. She appears well-developed and well-nourished.  HENT:  Head: Normocephalic and atraumatic.  Nose: Nose normal.  Eyes: Conjunctivae and EOM are normal. Pupils are equal, round, and reactive to light.  Neck: Neck supple. No JVD present. No thyromegaly present.  Cardiovascular: Normal rate, regular rhythm and normal heart sounds.  Exam reveals no gallop and no friction rub.   No murmur heard. Respiratory: Effort normal and breath sounds normal. No stridor. No respiratory distress. She has no wheezes. She has no rales.  GI: Soft. Bowel sounds are normal.  Musculoskeletal: Normal range of motion. She exhibits no edema or tenderness.  Lymphadenopathy:    She has no cervical adenopathy.  Neurological: She is alert and oriented to person, place, and time. No cranial nerve deficit. She exhibits normal muscle tone. Coordination normal.  Skin: Skin is warm and dry. No rash noted. No erythema.    Review of Systems  Musculoskeletal: Positive for back pain.    Blood pressure 170/118, pulse 63, temperature 98.8 F (37.1 C), temperature source Oral, resp. rate 20, height 5' 5"  (1.651 m), weight 81.647 kg (180 lb), last menstrual period 04/11/2015, SpO2 99 %.Body mass index is 29.95 kg/(m^2).    MENTALSTATUS EXAMINATION: See SRA.                                                 Sleep:      Risk to Self: Suicidal Ideation: Yes-Currently Present Suicidal Intent: Yes-Currently Present Is patient at risk for suicide?: Yes Suicidal Plan?: No-Not Currently/Within Last 6 Months Access to Means: No What has been your use of drugs/alcohol within the last 12 months?: none How many times?: 0 Intentional Self Injurious Behavior: None Risk to Others: Homicidal Ideation: No-Not Currently/Within Last 6 Months Thoughts of Harm to Others: No Current Homicidal Intent: No Current Homicidal Plan: No Access to Homicidal Means: No History of harm to others?: No Assessment of Violence: None Noted Does patient have access to weapons?: No Criminal Charges Pending?: No Does patient have a court date: No Prior Inpatient Therapy: Prior Inpatient Therapy: No  Prior Outpatient Therapy: Prior Outpatient Therapy: Yes Prior Therapy Dates: CURRENT Prior Therapy Facilty/Provider(s): [facility] Reason for Treatment: H/O BIPOLAR; HAS NOT HAS MEDS IN 6 MONTHS; REFUSES TO TAKE PILLS Does patient have an ACCT team?: No Does patient have Intensive In-House Services?  : No Does patient have [facility] services? : No Does patient have P4[facility] services?: Unknown  Alcohol Screening: 1. How often do you have a drink containing alcohol?: Never 9. Have you or someone else been injured as a result of your drinking?: No 10. Has a relative or friend or a doctor or another health worker been concerned about your drinking or suggested you cut down?: No Alcohol Use Disorder Identification Test Final Score (AUDIT): 0 Brief Intervention: AUDIT score less than 7 or less-screening does not suggest unhealthy drinking-brief intervention not indicated  Allergies:   Allergies  Allergen Reactions  . Sulfa Antibiotics Swelling   Lab Results:  Results for orders placed or performed during the hospital encounter of 04/11/15 (from the past 48 hour(s))  Acetaminophen level     Status: Abnormal   Collection Time:  04/11/15  7:41 AM  Result Value Ref Range   Acetaminophen (Tylenol), Serum <10 (L) 10 - 30 ug/mL    Comment:        THERAPEUTIC CONCENTRATIONS VARY SIGNIFICANTLY. A RANGE OF 10-30 ug/mL MAY BE AN EFFECTIVE CONCENTRATION FOR MANY PATIENTS. HOWEVER, SOME ARE BEST TREATED AT CONCENTRATIONS OUTSIDE THIS RANGE. ACETAMINOPHEN CONCENTRATIONS >150 ug/mL AT 4 HOURS AFTER INGESTION AND >50 ug/mL AT 12 HOURS AFTER INGESTION ARE OFTEN ASSOCIATED WITH TOXIC REACTIONS.   CBC     Status: Abnormal   Collection Time: 04/11/15  7:41 AM  Result Value Ref Range   WBC 8.8 3.6 - 11.0 K/uL   RBC 4.35 3.80 - 5.20 MIL/uL   Hemoglobin 10.8 (L) 12.0 - 16.0 g/dL   HCT 34.2 (L) 35.0 - 47.0 %   MCV 78.8 (L) 80.0 - 100.0 fL   MCH 24.9 (L) 26.0 - 34.0 pg   MCHC 31.6 (L) 32.0 - 36.0 g/dL   RDW 18.2 (H) 11.5 - 14.5 %   Platelets 201 150 - 440 K/uL  Comprehensive metabolic panel     Status: Abnormal   Collection Time: 04/11/15  7:41 AM  Result Value Ref Range   Sodium 139 135 - 145 mmol/L   Potassium 3.4 (L) 3.5 - 5.1 mmol/L   Chloride 106 101 - 111 mmol/L   CO2 26 22 - 32 mmol/L   Glucose, Bld 100 (H) 65 - 99 mg/dL   BUN 9 6 - 20 mg/dL   Creatinine, Ser 0.66 0.44 - 1.00 mg/dL   Calcium 8.9 8.9 - 10.3 mg/dL   Total Protein 7.5 6.5 - 8.1 g/dL   Albumin 4.0 3.5 - 5.0 g/dL   AST 13 (L) 15 - 41 U/L   ALT 9 (L) 14 - 54 U/L   Alkaline Phosphatase 46 38 - 126 U/L   Total Bilirubin 0.3 0.3 - 1.2 mg/dL   GFR calc non Af Amer >60 >60 mL/min   GFR calc Af Amer >60 >60 mL/min    Comment: (NOTE) The eGFR has been calculated using the CKD EPI equation. This calculation has not been validated in all clinical situations. eGFR's persistently <90 mL/min signify possible Chronic Kidney Disease.    Anion gap 7 5 - 15  Ethanol (ETOH)     Status: None   Collection Time: 04/11/15  7:41 AM  Result Value Ref Range   Alcohol,  Ethyl (B) <5 <5 mg/dL    Comment:        LOWEST DETECTABLE LIMIT FOR SERUM ALCOHOL IS 11  mg/dL FOR MEDICAL PURPOSES ONLY   Salicylate level     Status: None   Collection Time: 04/11/15  7:41 AM  Result Value Ref Range   Salicylate Lvl <8.6 2.8 - 30.0 mg/dL  Pregnancy, urine POC     Status: None   Collection Time: 04/11/15  9:10 AM  Result Value Ref Range   Preg Test, Ur NEGATIVE NEGATIVE    Comment:        THE SENSITIVITY OF THIS METHODOLOGY IS >24 mIU/mL   Urine Drug Screen, Qualitative [facility])     Status: Abnormal   Collection Time: 04/11/15  9:10 AM  Result Value Ref Range   Tricyclic, Ur Screen NONE DETECTED NONE DETECTED   Amphetamines, Ur Screen NONE DETECTED NONE DETECTED   MDMA (Ecstasy)Ur Screen NONE DETECTED NONE DETECTED   Cocaine Metabolite,Ur [place] NONE DETECTED NONE DETECTED   Opiate, Ur Screen NONE DETECTED NONE DETECTED   Phencyclidine (PCP) Ur S NONE DETECTED NONE DETECTED   Cannabinoid 50 Ng, Ur [place] POSITIVE (A) NONE DETECTED   Barbiturates, Ur Screen NONE DETECTED NONE DETECTED   Benzodiazepine, Ur Scrn NONE DETECTED NONE DETECTED   Methadone Scn, Ur NONE DETECTED NONE DETECTED    Comment: (NOTE) 767  Tricyclics, urine               Cutoff 1000 ng/mL 200  Amphetamines, urine             Cutoff 1000 ng/mL 300  MDMA (Ecstasy), urine           Cutoff 500 ng/mL 400  Cocaine Metabolite, urine       Cutoff 300 ng/mL 500  Opiate, urine                   Cutoff 300 ng/mL 600  Phencyclidine (PCP), urine      Cutoff 25 ng/mL 700  Cannabinoid, urine              Cutoff 50 ng/mL 800  Barbiturates, urine             Cutoff 200 ng/mL 900  Benzodiazepine, urine           Cutoff 200 ng/mL 1000 Methadone, urine                Cutoff 300 ng/mL 1100 1200 The urine drug screen provides only a preliminary, unconfirmed 1300 analytical test result and should not be used for non-medical 1400 purposes. Clinical consideration and professional judgment should 1500 be applied to any positive drug screen result due to possible 1600 interfering substances. A more specific  alternate chemical method 1700 must be used in order to obtain a confirmed analytical result.  1800 Gas chromato graphy / mass spectrometry (GC/MS) is the preferred 1900 confirmatory method.    Current Medications: Current Facility-Administered Medications  Medication Dose Route Frequency Provider Last Rate Last Dose  . acetaminophen (TYLENOL) tablet 650 mg  650 mg Oral Q6H PRN Jolanta B Pucilowska, MD      . alum & mag hydroxide-simeth (MAALOX/MYLANTA) 200-200-20 MG/5ML suspension 30 mL  30 mL Oral Q4H PRN Jolanta B Pucilowska, MD      . cloNIDine (CATAPRES) tablet 0.1 mg  0.1 mg Oral TID PRN Jolanta B Pucilowska, MD      . cyclobenzaprine (FLEXERIL) tablet 5 mg  5 mg Oral TID PRN  Clovis Fredrickson, MD      . ibuprofen (ADVIL,MOTRIN) tablet 800 mg  800 mg Oral BID PRN Jolanta B Pucilowska, MD      . magnesium hydroxide (MILK OF MAGNESIA) suspension 30 mL  30 mL Oral Daily PRN Jolanta B Pucilowska, MD      . nicotine (NICODERM CQ - dosed in mg/24 hours) patch 14 mg  14 mg Transdermal Q0600 Clovis Fredrickson, MD   14 mg at 04/12/15 0756  . nicotine (NICOTROL) 10 MG inhaler 1 continuous puffing  1 continuous puffing Inhalation PRN Jolanta B Pucilowska, MD      . traZODone (DESYREL) tablet 50 mg  50 mg Oral QHS Carrie Mew, MD   50 mg at 04/11/15 2236   PTA Medications: Prescriptions prior to admission  Medication Sig Dispense Refill Last Dose  . cyclobenzaprine (FLEXERIL) 5 MG tablet Take 5 mg by mouth 3 (three) times daily as needed for muscle spasms.   04/10/2015 at Unknown time  . ibuprofen (ADVIL,MOTRIN) 800 MG tablet Take 800 mg by mouth 2 (two) times daily as needed for mild pain.   04/10/2015 at Unknown time  . [DISCONTINUED] traZODone (DESYREL) 50 MG tablet Take 50 mg by mouth at bedtime as needed for sleep.    Past Month at Unknown time    Previous Psychotropic Medications: Yes   Substance Abuse History in the last 12 months:  Yes.      Consequences of Substance  Abuse: Medical Consequences:  The patient believes cannabis is medicinat and substitutes for medications. Legal Consequences:  Risk of legal action. Family Consequences:  Small children in the chouse.  Results for orders placed or performed during the hospital encounter of 04/11/15 (from the past 72 hour(s))  Acetaminophen level     Status: Abnormal   Collection Time: 04/11/15  7:41 AM  Result Value Ref Range   Acetaminophen (Tylenol), Serum <10 (L) 10 - 30 ug/mL    Comment:        THERAPEUTIC CONCENTRATIONS VARY SIGNIFICANTLY. A RANGE OF 10-30 ug/mL MAY BE AN EFFECTIVE CONCENTRATION FOR MANY PATIENTS. HOWEVER, SOME ARE BEST TREATED AT CONCENTRATIONS OUTSIDE THIS RANGE. ACETAMINOPHEN CONCENTRATIONS >150 ug/mL AT 4 HOURS AFTER INGESTION AND >50 ug/mL AT 12 HOURS AFTER INGESTION ARE OFTEN ASSOCIATED WITH TOXIC REACTIONS.   CBC     Status: Abnormal   Collection Time: 04/11/15  7:41 AM  Result Value Ref Range   WBC 8.8 3.6 - 11.0 K/uL   RBC 4.35 3.80 - 5.20 MIL/uL   Hemoglobin 10.8 (L) 12.0 - 16.0 g/dL   HCT 34.2 (L) 35.0 - 47.0 %   MCV 78.8 (L) 80.0 - 100.0 fL   MCH 24.9 (L) 26.0 - 34.0 pg   MCHC 31.6 (L) 32.0 - 36.0 g/dL   RDW 18.2 (H) 11.5 - 14.5 %   Platelets 201 150 - 440 K/uL  Comprehensive metabolic panel     Status: Abnormal   Collection Time: 04/11/15  7:41 AM  Result Value Ref Range   Sodium 139 135 - 145 mmol/L   Potassium 3.4 (L) 3.5 - 5.1 mmol/L   Chloride 106 101 - 111 mmol/L   CO2 26 22 - 32 mmol/L   Glucose, Bld 100 (H) 65 - 99 mg/dL   BUN 9 6 - 20 mg/dL   Creatinine, Ser 0.66 0.44 - 1.00 mg/dL   Calcium 8.9 8.9 - 10.3 mg/dL   Total Protein 7.5 6.5 - 8.1 g/dL   Albumin 4.0 3.5 - 5.0  g/dL   AST 13 (L) 15 - 41 U/L   ALT 9 (L) 14 - 54 U/L   Alkaline Phosphatase 46 38 - 126 U/L   Total Bilirubin 0.3 0.3 - 1.2 mg/dL   GFR calc non Af Amer >60 >60 mL/min   GFR calc Af Amer >60 >60 mL/min    Comment: (NOTE) The eGFR has been calculated using the CKD EPI  equation. This calculation has not been validated in all clinical situations. eGFR's persistently <90 mL/min signify possible Chronic Kidney Disease.    Anion gap 7 5 - 15  Ethanol (ETOH)     Status: None   Collection Time: 04/11/15  7:41 AM  Result Value Ref Range   Alcohol, Ethyl (B) <5 <5 mg/dL    Comment:        LOWEST DETECTABLE LIMIT FOR SERUM ALCOHOL IS 11 mg/dL FOR MEDICAL PURPOSES ONLY   Salicylate level     Status: None   Collection Time: 04/11/15  7:41 AM  Result Value Ref Range   Salicylate Lvl <5.7 2.8 - 30.0 mg/dL  Pregnancy, urine POC     Status: None   Collection Time: 04/11/15  9:10 AM  Result Value Ref Range   Preg Test, Ur NEGATIVE NEGATIVE    Comment:        THE SENSITIVITY OF THIS METHODOLOGY IS >24 mIU/mL   Urine Drug Screen, Qualitative [facility])     Status: Abnormal   Collection Time: 04/11/15  9:10 AM  Result Value Ref Range   Tricyclic, Ur Screen NONE DETECTED NONE DETECTED   Amphetamines, Ur Screen NONE DETECTED NONE DETECTED   MDMA (Ecstasy)Ur Screen NONE DETECTED NONE DETECTED   Cocaine Metabolite,Ur [place] NONE DETECTED NONE DETECTED   Opiate, Ur Screen NONE DETECTED NONE DETECTED   Phencyclidine (PCP) Ur S NONE DETECTED NONE DETECTED   Cannabinoid 50 Ng, Ur [place] POSITIVE (A) NONE DETECTED   Barbiturates, Ur Screen NONE DETECTED NONE DETECTED   Benzodiazepine, Ur Scrn NONE DETECTED NONE DETECTED   Methadone Scn, Ur NONE DETECTED NONE DETECTED    Comment: (NOTE) 846  Tricyclics, urine               Cutoff 1000 ng/mL 200  Amphetamines, urine             Cutoff 1000 ng/mL 300  MDMA (Ecstasy), urine           Cutoff 500 ng/mL 400  Cocaine Metabolite, urine       Cutoff 300 ng/mL 500  Opiate, urine                   Cutoff 300 ng/mL 600  Phencyclidine (PCP), urine      Cutoff 25 ng/mL 700  Cannabinoid, urine              Cutoff 50 ng/mL 800  Barbiturates, urine             Cutoff 200 ng/mL 900  Benzodiazepine, urine           Cutoff 200 ng/mL 1000  Methadone, urine                Cutoff 300 ng/mL 1100 1200 The urine drug screen provides only a preliminary, unconfirmed 1300 analytical test result and should not be used for non-medical 1400 purposes. Clinical consideration and professional judgment should 1500 be applied to any positive drug screen result due to possible 1600 interfering substances. A more specific alternate chemical method 1700 must  be used in order to obtain a confirmed analytical result.  1800 Gas chromato graphy / mass spectrometry (GC/MS) is the preferred 1900 confirmatory method.     Observation Level/Precautions:  15 minute checks  Laboratory: none  Psychotherapy:  Supportive.  Medications:  See med list.  Consultations:  Hospitalist consult.  Discharge Concerns:  Family meeting requitred.  Estimated LOS: 1-2 days.  Other:     Psychological Evaluations: No  Treatment Plan Summary: Daily contact with patient to assess and evaluate symptoms and progress in treatment, Medication management and Plan na  Medical Decision Making:    1. Suicidal ideation. The patient is able to contract for safety in the hospital.  2.  Mood. The patient was treated with Saphris and Cymbalta in the past. She no longer is interested in pharmacotherapy.  3.  Insomnia. The patient in the past responde well to trazodone. We'll offer trazodone 100 mg at bedtime.  4. Chronic pain. She has been maintained on Flexeril and ibuprofen on as needed basis in the communitydoes report  and ibuprofen. She just finished her menstrual periods and is not as much in pain today.  5. Smoking. Nicotine products are available. She is not interested in smoking cessation at present.   6. Substance abuse. The patient smokes marijuana daily. She considers it medicinal use. She is not interested in substance abuse treatment.   7. Social. The patient agreed to meet with her husband in a family meeting. If this is to discuss his treatment plans.  8.  Disposition. At the patient will be discharged to home with her husband. She will follow up with [facility]. She will continue with peer support.    I certify that inpatient services furnished can reasonably be expected to improve the patient's condition.   PUCILOWSKA, JOLANTA 5/3/20162:31 PM

## 2015-04-12 NOTE — ED Notes (Signed)
Pt to nurses station requesting to sleep in recliner in day room as the bed is hurting her back. Pt offered extra pillow for comfort and advised day room is closed at this time and she may not stay. Pt verbalizes understanding and provided with extra pillow. Pt back to her room and lying in bed.

## 2015-04-12 NOTE — ED Notes (Signed)
Pt to nurses station c/o back pain from bed. Pt stating once again that she is going to call her lawyer, tv news, newspaper, friends, and family and let them know how awful this place is and how we are holding her against her will. Pt advised I will speak with MD for medication for her back.

## 2015-04-12 NOTE — ED Notes (Signed)
Pt resting in bed with eyes closed. No unusual behavior observed. Pt has no needs or concerns at this time. Will continue to monitor and f/u as needed.  

## 2015-04-12 NOTE — Progress Notes (Signed)
Recreation Therapy Notes  Date: 05.03.16 Time: 3:00 pm Location: Craft Room  Group Topic: Self-esteem  Goal Area(s) Addresses:  Patient will identify positive attributes about self. Patient will identify at least one coping skill.  Behavioral Response: Did not attend  Intervention: All About Me  Activity: Patients were instructed to make an All About Me pamphlet including positive traits about themselves, healthy coping skills, and their healthy support system.  Education:LRT educated patient on self-esteem, coping skills, and healthy support systems  Education Outcome: Patient did not attend group.  Clinical Observations/Feedback: Patient did not attend group.     Jacquelynn CreeGreene,Wanetta Funderburke M, LRT/CTRS 04/12/2015 4:32 PM

## 2015-04-12 NOTE — BH Assessment (Signed)
Assessment Note  Anna Harris is an 34 y.o. female. Anna Harris is an 34 y.o. female married with three sons with stating, I drove myself here; I just wasn't feeling good; this morning; I was feeling like somebody could give me something to end it; like Dr. Parks Ranger; I'm bipolar; I have calcium deposits all over my body; those pills did that; I haven't had any pills in 6 months; at home, I stay to myself; that works for me; I don't like stupid stuff; I'm a logical person; that's it."  Axis I: Bipolar, mixed Axis II: Deferred Axis III:  Past Medical History  Diagnosis Date  . Dysmenorrhea   . Hypertension   . Sciatic pain   . Back pain   . Bipolar 1 disorder    Axis IV: other psychosocial or environmental problems, problems related to social environment, problems with access to health care services and problems with primary support group Axis V: 21-30 behavior considerably influenced by delusions or hallucinations OR serious impairment in judgment, communication OR inability to function in almost all areas  Past Medical History:  Past Medical History  Diagnosis Date  . Dysmenorrhea   . Hypertension   . Sciatic pain   . Back pain   . Bipolar 1 disorder     Past Surgical History  Procedure Laterality Date  . Cesarean section      Family History:  Family History  Problem Relation Age of Onset  . Diabetes Father   . Hypertension Father   . Hypertension Mother     Social History:  reports that she has been smoking Cigarettes.  She has never used smokeless tobacco. She reports that she uses illicit drugs (Marijuana). She reports that she does not drink alcohol.  Additional Social History:  Alcohol / Drug Use Pain Medications: none Prescriptions: none Over the Counter: none History of alcohol / drug use?: No history of alcohol / drug abuse  CIWA: CIWA-Ar BP: (!) 170/118 mmHg Pulse Rate: 63 COWS:    Allergies:  Allergies  Allergen Reactions  . Sulfa  Antibiotics Swelling    Home Medications:  Medications Prior to Admission  Medication Sig Dispense Refill  . cyclobenzaprine (FLEXERIL) 5 MG tablet Take 5 mg by mouth 3 (three) times daily as needed for muscle spasms.    Marland Kitchen ibuprofen (ADVIL,MOTRIN) 800 MG tablet Take 800 mg by mouth 2 (two) times daily as needed for mild pain.    . [DISCONTINUED] traZODone (DESYREL) 50 MG tablet Take 50 mg by mouth at bedtime as needed for sleep.       OB/GYN Status:  Patient's last menstrual period was 04/11/2015.  General Assessment Data Location of Assessment: [facility] ED TTS Assessment: In system Is this a Tele or Face-to-Face Assessment?: Face-to-Face Is this an Initial Assessment or a Re-assessment for this encounter?: Initial Assessment Marital status: Married Is patient pregnant?: No Pregnancy Status: No Living Arrangements: Spouse/significant other Can pt return to current living arrangement?: Yes Admission Status: Voluntary Is patient capable of signing voluntary admission?: Yes Referral Source: Self/Family/Friend  Medical Screening Exam [facility] Walk-in ONLY) Medical Exam completed: Yes  Crisis Care Plan Living Arrangements: Spouse/significant other Name of Psychiatrist: rha Name of Therapist: Adair Laundry  Education Status Is patient currently in school?: No Highest grade of school patient has completed: 12 Contact person: hu  Risk to self with the past 6 months Suicidal Ideation: Yes-Currently Present Has patient been a risk to self within the past 6 months prior to admission? :  Yes Suicidal Intent: Yes-Currently Present Has patient had any suicidal intent within the past 6 months prior to admission? : Yes Is patient at risk for suicide?: Yes Suicidal Plan?: No-Not Currently/Within Last 6 Months Has patient had any suicidal plan within the past 6 months prior to admission? : No Access to Means: No What has been your use of drugs/alcohol within the last 12 months?: none Previous  Attempts/Gestures: No How many times?: 0 Intentional Self Injurious Behavior: None Family Suicide History: No Recent stressful life event(s): Other (Comment) (marital) Persecutory voices/beliefs?: No Depression: Yes Depression Symptoms: Feeling worthless/self pity (hopeless, helpless) Substance abuse history and/or treatment for substance abuse?: Yes Suicide prevention information given to non-admitted patients: Yes  Risk to Others within the past 6 months Homicidal Ideation: No-Not Currently/Within Last 6 Months Does patient have any lifetime risk of violence toward others beyond the six months prior to admission? : No Thoughts of Harm to Others: No Current Homicidal Intent: No Current Homicidal Plan: No Access to Homicidal Means: No History of harm to others?: No Assessment of Violence: None Noted Does patient have access to weapons?: No Criminal Charges Pending?: No Does patient have a court date: No Is patient on probation?: No  Psychosis Hallucinations: None noted Delusions: None noted  Mental Status Report Appearance/Hygiene: In scrubs Eye Contact: Good Motor Activity:  (calm) Speech: Logical/coherent Level of Consciousness: Alert Mood: Depressed, Sad Affect: Fearful, Anxious, Sad Anxiety Level: Moderate Thought Processes: Circumstantial Judgement: Impaired Orientation: Situation, Person, Place, Time Obsessive Compulsive Thoughts/Behaviors: None  Cognitive Functioning Concentration: Normal Memory: Recent Intact, Remote Intact IQ: Average Insight: Poor Impulse Control: Fair Appetite: Fair Weight Loss:  (NONE) Weight Gain: 0 Sleep: Decreased Total Hours of Sleep: 4 Vegetative Symptoms: None  ADLScreening [facility] Assessment Services) Patient's cognitive ability adequate to safely complete daily activities?: Yes Patient able to express need for assistance with ADLs?: Yes Independently performs ADLs?: Yes (appropriate for developmental age)  Prior Inpatient  Therapy Prior Inpatient Therapy: No  Prior Outpatient Therapy Prior Outpatient Therapy: Yes Prior Therapy Dates: CURRENT Prior Therapy Facilty/Provider(s): [facility] Reason for Treatment: H/O BIPOLAR; HAS NOT HAS MEDS IN 6 MONTHS; REFUSES TO TAKE PILLS Does patient have an ACCT team?: No Does patient have Intensive In-House Services?  : No Does patient have [facility] services? : No Does patient have P4CC services?: Unknown  ADL Screening (condition at time of admission) Patient's cognitive ability adequate to safely complete daily activities?: Yes Is the patient deaf or have difficulty hearing?: No Does the patient have difficulty seeing, even when wearing glasses/contacts?: No Does the patient have difficulty concentrating, remembering, or making decisions?: No Patient able to express need for assistance with ADLs?: Yes Does the patient have difficulty dressing or bathing?: No Independently performs ADLs?: Yes (appropriate for developmental age) Does the patient have difficulty walking or climbing stairs?: No Weakness of Legs: None Weakness of Arms/Hands: None  Home Assistive Devices/Equipment Home Assistive Devices/Equipment: None  Therapy Consults (therapy consults require a physician order) PT Evaluation Needed: No OT Evalulation Needed: No SLP Evaluation Needed: No Abuse/Neglect Assessment (Assessment to be complete while patient is alone) Physical Abuse: Denies Verbal Abuse: Denies Sexual Abuse: Denies Exploitation of patient/patient's resources: Denies Self-Neglect: Denies Values / Beliefs Cultural Requests During Hospitalization: None Spiritual Requests During Hospitalization: None Consults Spiritual Care Consult Needed: No Social Work Consult Needed: No Merchant navy officer (For Healthcare) Does patient have an advance directive?: No Would patient like information on creating an advanced directive?: No - patient declined information Nutrition Screen-  [facility]  Adult/WL/AP Patient's home diet: Regular Has the patient recently lost weight without trying?: No Has the patient been eating poorly because of a decreased appetite?: No Malnutrition Screening Tool Score: 0  Additional Information 1:1 In Past 12 Months?: No CIRT Risk: No Elopement Risk: No Does patient have medical clearance?: Yes     Disposition:  Disposition Initial Assessment Completed for this Encounter: Yes Disposition of Patient: Inpatient treatment program Type of inpatient treatment program: Adult Other disposition(s): Other (Comment) (PENDING)  On Site Evaluation by:   Reviewed with Physician:    Dwan BoltMargaret Jenean Escandon 04/12/2015 2:59 PM

## 2015-04-12 NOTE — Progress Notes (Signed)
Consult called into Dr Juliene PinaMody @1430 . For  Hypertension. Pager [phone]

## 2015-04-12 NOTE — Tx Team (Signed)
Initial Interdisciplinary Treatment Plan   PATIENT STRESSORS: Financial difficulties Health problems Marital or family conflict   PATIENT STRENGTHS: Ability for insight General fund of knowledge Motivation for treatment/growth   PROBLEM LIST: Problem List/Patient Goals Date to be addressed Date deferred Reason deferred Estimated date of resolution  Depression 04/12/15     Anxiety 04/12/15     Suicidal Ideation 04/12/15                                          DISCHARGE CRITERIA:  Improved stabilization in mood, thinking, and/or behavior  PRELIMINARY DISCHARGE PLAN: Outpatient therapy  PATIENT/FAMIILY INVOLVEMENT: This treatment plan has been presented to and reviewed with the patient, Irena CordsSharnel R Donahoo, and/or family member.  The patient and family have been given the opportunity to ask questions and make suggestions.  Gretta ArabHerbin, Nazair Fortenberry [facility] 04/12/2015, 6:54 AM

## 2015-04-12 NOTE — ED Notes (Signed)

## 2015-04-12 NOTE — Progress Notes (Signed)
Patient ID: Anna Harris, female   DOB: 07/03/1981, 34 y.o.   MRN: [id number] Pt denies SI/HI/AVH.  Pt admitted today IVC because "I'm am being held against my own free will. I came for emotional support from a nurse because of my husband's physical health."  Pt's husband has ulcers and has been losing weight. He has been throwing up daily in front of her and the kids and he refuses to go to the doctor.  She feels like he does not care about his health and is dying.  She is tired of him not trying to live.  Patient tearful on admission.

## 2015-04-12 NOTE — ED Notes (Signed)
Pt to nurses station c/o "my heart hurting". Ibuprofen administered as ordered. MD Dolores FrameSung notified of pt's complaint. EKG ordered and performed. MD Dolores FrameSung signed EKG. No further orders at this time.

## 2015-04-12 NOTE — Progress Notes (Signed)
Patient denies depression & suicidal ideation.Appropriate with staff & peers.DC instructions explained to patient,verbelized understanding.Prisciptions Personal belongings given to patient.Left unit with staff.

## 2015-04-12 NOTE — ED Provider Notes (Addendum)
-----------------------------------------   6:26 AM on 04/12/2015 -----------------------------------------   BP 160/102 mmHg  Pulse 72  Temp(Src) 98.2 F (36.8 C) (Oral)  Resp 18  Ht 5\' 5"  (1.651 m)  Wt 180 lb (81.647 kg)  BMI 29.95 kg/m2  SpO2 99%  LMP 04/11/2015  The patient had some agitation overnight for which she was verbally redirected. Ibuprofen given for complaint of musculoskeletal back pain. EKG performed for patient complaining of "my heart hurting". Vital signs stable, EKG sinus bradycardia at a rate of 54, normal axis, nonspecific ST-T changes. No further events or complaints. Patient was subsequently admitted to the behavioral medicine unit.    Irean HongJade J Sung, MD 04/12/15 [phone]  ----------------------------------------- 7:11 AM on 05/20/2015 -----------------------------------------  I personally viewed and interpreted the above EKG.  Irean HongJade J Sung, MD 05/20/15 [phone]

## 2015-04-12 NOTE — ED Notes (Signed)
BEHAVIORAL HEALTH ROUNDING Patient sleeping: Yes.   Patient alert and oriented: asleep Behavior appropriate: Yes.  ; If no, describe: asleep Nutrition and fluids offered: asleep Toileting and hygiene offered: asleep Sitter present: no Law enforcement present: Yes, ODS

## 2015-04-12 NOTE — ED Notes (Signed)
Pt provided with sandwich tray and drink.  

## 2015-04-12 NOTE — ED Notes (Signed)
BEHAVIORAL HEALTH ROUNDING Patient sleeping: Yes.   Patient alert and oriented: asleep Behavior appropriate: Yes.  ; If no, describe: asleep Nutrition and fluids offered: asleep Toileting and hygiene offered: asleep Sitter present: no Law enforcement present: Yes, ODS 

## 2015-04-12 NOTE — Progress Notes (Signed)
Recreation Therapy Notes  At approximately 4:00 pm, LRT met with patient in patient room. LRT provided patient with a list of activities for patient to do with her family.    Leonette [facility], LRT/CTRS 04/12/2015 4:55 PM

## 2015-04-12 NOTE — BHH Suicide Risk Assessment (Signed)
[facility] Admission Suicide Risk Assessment   Nursing information obtained from:  Patient Demographic factors:  Unemployed Current Mental Status:    Loss Factors:  Financial problems / change in socioeconomic status Historical Factors:  Family history of mental illness or substance abuse Risk Reduction Factors:  Responsible for children under 33 years of age, Sense of responsibility to family, Living with another person, especially a relative, Positive social support Total Time spent with patient: 1 hour Principal Problem: Bipolar 1 disorder, depressed, severe Diagnosis:   Patient Active Problem List   Diagnosis Date Noted  . Bipolar 1 disorder, manic, moderate [F31.12] 04/12/2015  . Tobacco use disorder [Z72.0] 04/12/2015  . Accelerated hypertension [I10] 04/12/2015  . Bipolar 1 disorder, depressed, severe [F31.4] 04/11/2015     Continued Clinical Symptoms:  Alcohol Use Disorder Identification Test Final Score (AUDIT): 0 The "Alcohol Use Disorders Identification Test", Guidelines for Use in Primary Care, Second Edition.  World Science writer [facility]). Score between 0-7:  no or low risk or alcohol related problems. Score between 8-15:  moderate risk of alcohol related problems. Score between 16-19:  high risk of alcohol related problems. Score 20 or above:  warrants further diagnostic evaluation for alcohol dependence and treatment.   CLINICAL FACTORS:   Bipolar Disorder:   Depressive phase Depression:   Recent sense of peace/wellbeing Alcohol/Substance Abuse/Dependencies Chronic Pain More than one psychiatric diagnosis Previous Psychiatric Diagnoses and Treatments   Musculoskeletal: Strength & Muscle Tone: within normal limits Gait & Station: normal Patient leans: N/A  Psychiatric Specialty Exam: Physical Exam  ROS Review of Systems - Negative except abdominal pain with menstruation and back pain.  Blood pressure 170/118, pulse 63, temperature 98.8 F (37.1 C),  temperature source Oral, resp. rate 20, height  (1.651 m), weight 81.647 kg (180 lb), last menstrual period 04/11/2015, SpO2 99 %.Body mass index is 29.95 kg/(m^2).  General Appearance: Casual  Eye Contact::  Fair  Speech:  Clear and Coherent  Volume:  Normal  Mood:  Negative and Depressed  Affect:  Blunt  Thought Process:  Goal Directed  Orientation:  Full (Time, Place, and Person)  Thought Content:  WDL  Suicidal Thoughts:  No  Homicidal Thoughts:  No  Memory:  Immediate;   Fair Recent;   Fair Remote;   Fair  Judgement:  Fair  Insight:  Fair  Psychomotor Activity:  Normal  Concentration:  Fair  Recall:  [employer] of Knowledge:Fair  Language: Fair  Akathisia:  No  Handed:  Right  AIMS (if indicated):     Assets:  Desire for Improvement Financial Resources/Insurance Housing Physical Health Resilience Social Support Transportation  Sleep:     Cognition: WNL  ADL's:  Intact     COGNITIVE FEATURES THAT CONTRIBUTE TO RISK:  None    SUICIDE RISK:   Moderate:  Frequent suicidal ideation with limited intensity, and duration, some specificity in terms of plans, no associated intent, good self-control, limited dysphoria/symptomatology, some risk factors present, and identifiable protective factors, including available and accessible social support.  PLAN OF CARE:  1. Suicide precautions. 2. Medication mangment. 3. Program participation.  4. Medicine consult for HTN.  Medical Decision Making:  Review of Psycho-Social Stressors (1), Review or order clinical lab tests (1), New Problem, with no additional work-up planned (3), Review of Medication Regimen & Side Effects (2) and Review of New Medication or Change in Dosage (2)  I certify that inpatient services furnished can reasonably be expected to improve the patient's condition.  Kristine LineaUCILOWSKA, Gordana Kewley 04/12/2015, 1:15 PM

## 2015-04-12 NOTE — ED Notes (Signed)
ED BHU PLACEMENT JUSTIFICATION Is the patient under IVC or is there intent for IVC: Yes.   Is the patient medically cleared: Yes.   Is there vacancy in the ED BHU: Yes.   Is the population mix appropriate for patient: Yes.   Is the patient awaiting placement in inpatient or outpatient setting: Yes.   Has the patient had a psychiatric consult: Yes.   Survey of unit performed for contraband, proper placement and condition of furniture, tampering with fixtures in bathroom, shower, and each patient room: Yes.  ; Findings:  APPEARANCE/BEHAVIOR cooperative NEURO ASSESSMENT Orientation: X3 Hallucinations: No.None noted (Hallucinations) Speech: Normal Gait: normal RESPIRATORY ASSESSMENT Breathing Pattern: {BREATHING:* even and unlabored  CARDIOVASCULAR ASSESSMENT regular rate and rhythm, S1, S2 normal, no murmur, click, rub or gallop and regular rate, skin color wnl, pulses paplable and wnl  GASTROINTESTINAL ASSESSMENT wnl EXTREMITIES,moves all extremities  PLAN OF CARE Provide calm/safe environment. Vital signs assessed twice daily. ED BHU Assessment once each 12-hour shift. Collaborate with intake RN daily or as condition indicates. Assure the ED provider has rounded once each shift. Provide and encourage hygiene. Provide redirection as needed. Assess for escalating behavior; address immediately and inform ED provider.  Assess family dynamic and appropriateness for visitation as needed: Yes.  ; If necessary, describe findings:  Educate the patient/family about BHU procedures/visitation: Yes.  ; If necessary, describe findings:

## 2015-04-12 NOTE — ED Notes (Signed)
Pt lying in bed resting with eyes closed. No unusual behavior observed. Pt has no needs or concerns at this time. Will continue to monitor and f/u as needed.

## 2015-04-12 NOTE — Discharge Summary (Signed)
Physician Discharge Summary Note  Patient:  Anna Harris is an 34 y.o., female MRN:  [id number] DOB:  1981-04-21 Patient phone:  [phone] (home)  Patient address:   [place] [address],  Total Time spent with patient: 1 hour  Date of Admission:  04/11/2015 Date of Discharge: 04/12/2015 Reason for Admission:  Depression and suicidal ideation.  Principal Problem: Bipolar 1 disorder, depressed, severe Discharge Diagnoses: Bipolar I disorder depressed severe. Patient Active Problem List   Diagnosis Date Noted  . Bipolar 1 disorder, manic, moderate [F31.12] 04/12/2015  . Tobacco use disorder [Z72.0] 04/12/2015  . Accelerated hypertension [I10] 04/12/2015  . Cannabis abuse [F12.10] 04/12/2015  . Bipolar 1 disorder, depressed, severe [F31.4] 04/11/2015    Musculoskeletal: Strength & Muscle Tone: within normal limits Gait & Station: normal Patient leans: N/A  Psychiatric Specialty Exam: Physical Exam  ROS Review of Systems - Negative except abdominal pain with menstruation and back pain.  Blood pressure 177/116, pulse 63, temperature 98.8 F (37.1 C), temperature source Oral, resp. rate 20, height 5' 5"  (1.651 m), weight 81.647 kg (180 lb), last menstrual period 04/11/2015, SpO2 99 %.Body mass index is 29.95 kg/(m^2).    See SRA.                                                Sleep:      Past Medical History:  Past Medical History  Diagnosis Date  . Dysmenorrhea   . Hypertension   . Sciatic pain   . Back pain   . Bipolar 1 disorder     Past Surgical History  Procedure Laterality Date  . Cesarean section     Family History:  Family History  Problem Relation Age of Onset  . Diabetes Father   . Hypertension Father   . Hypertension Mother    Social History:  History  Alcohol Use No     History  Drug Use  . Yes  . Special: Marijuana    History   Social History  . Marital Status: Married    Spouse Name: N/A  .  Number of Children: N/A  . Years of Education: N/A   Social History Main Topics  . Smoking status: Current Every Day Smoker    Types: Cigarettes  . Smokeless tobacco: Never Used  . Alcohol Use: No  . Drug Use: Yes    Special: Marijuana  . Sexual Activity: Yes    Birth Control/ Protection: None   Other Topics Concern  . None   Social History Narrative    Past Psychiatric History: Hospitalizations:  Outpatient Care:  Substance Abuse Care:  Self-Mutilation:  Suicidal Attempts:  Violent Behaviors:   Risk to Self: Suicidal Ideation: Yes-Currently Present Suicidal Intent: Yes-Currently Present Is patient at risk for suicide?: Yes Suicidal Plan?: No-Not Currently/Within Last 6 Months Access to Means: No What has been your use of drugs/alcohol within the last 12 months?: none How many times?: 0 Intentional Self Injurious Behavior: None Risk to Others: Homicidal Ideation: No-Not Currently/Within Last 6 Months Thoughts of Harm to Others: No Current Homicidal Intent: No Current Homicidal Plan: No Access to Homicidal Means: No History of harm to others?: No Assessment of Violence: None Noted Does patient have access to weapons?: No Criminal Charges Pending?: No Does patient have a court date: No Prior Inpatient Therapy: Prior Inpatient Therapy:  No Prior Outpatient Therapy: Prior Outpatient Therapy: Yes Prior Therapy Dates: CURRENT Prior Therapy Facilty/Provider(s): [facility] Reason for Treatment: H/O BIPOLAR; HAS NOT HAS MEDS IN 6 MONTHS; REFUSES TO TAKE PILLS Does patient have an ACCT team?: No Does patient have Intensive In-House Services?  : No Does patient have [facility] services? : No Does patient have P[facility] services?: Unknown  Level of Care:  OP  Hospital Course:  Mr. Bartling was admitted to [facility] behavioral medicine unit for worsening of depression and passive suicidal ideation in the context of conflict in the family and medication noncompliance. She made  a comment that she would like to fall asleep and the wake up after 10 years when the trouble is over. She adamantly denies suicidal ideatio, intention, or plan. She is a loving wife and mother of 3 little boys. 7 months ago she decided to stop her bipolar medications and has been doing well until her husband fell ill. She is concerned that the husband refuses to see a doctor. She requested a family meeting with the husband. This was completed prior to discharge. The couple seem to have better understanding of what to expect of one another.   At the time of discharge the patient is no longer suicidal homicidal or excessively anxious she is forward thinking and optimistic about the future she is a loving mother and wife  The patient declines any psychotropic medication. This includes antihypertensives. She no longer believes in pharmacotherapy. She has been using alternative approaches. She thinks that they were working fine until the recent crisis. She is open to psychotherap. We ordered medicine consultation to evaluate her high blood pressure. We arev still awaiting consult.  Consults:  hospitalist.  Significant Diagnostic Studies:  None  Discharge Vitals:   Blood pressure 177/116, pulse 63, temperature 98.8 F (37.1 C), temperature source Oral, resp. rate 20, height 5' 5"  (1.651 m), weight 81.647 kg (180 lb), last menstrual period 04/11/2015, SpO2 99 %. Body mass index is 29.95 kg/(m^2). Lab Results:   Results for orders placed or performed during the hospital encounter of 04/11/15 (from the past 72 hour(s))  Acetaminophen level     Status: Abnormal   Collection Time: 04/11/15  7:41 AM  Result Value Ref Range   Acetaminophen (Tylenol), Serum <10 (L) 10 - 30 ug/mL    Comment:        THERAPEUTIC CONCENTRATIONS VARY SIGNIFICANTLY. A RANGE OF 10-30 ug/mL MAY BE AN EFFECTIVE CONCENTRATION FOR MANY PATIENTS. HOWEVER, SOME ARE BEST TREATED AT CONCENTRATIONS OUTSIDE THIS RANGE. ACETAMINOPHEN  CONCENTRATIONS >150 ug/mL AT 4 HOURS AFTER INGESTION AND >50 ug/mL AT 12 HOURS AFTER INGESTION ARE OFTEN ASSOCIATED WITH TOXIC REACTIONS.   CBC     Status: Abnormal   Collection Time: 04/11/15  7:41 AM  Result Value Ref Range   WBC 8.8 3.6 - 11.0 K/uL   RBC 4.35 3.80 - 5.20 MIL/uL   Hemoglobin 10.8 (L) 12.0 - 16.0 g/dL   HCT 34.2 (L) 35.0 - 47.0 %   MCV 78.8 (L) 80.0 - 100.0 fL   MCH 24.9 (L) 26.0 - 34.0 pg   MCHC 31.6 (L) 32.0 - 36.0 g/dL   RDW 18.2 (H) 11.5 - 14.5 %   Platelets 201 150 - 440 K/uL  Comprehensive metabolic panel     Status: Abnormal   Collection Time: 04/11/15  7:41 AM  Result Value Ref Range   Sodium 139 135 - 145 mmol/L   Potassium 3.4 (L) 3.5 - 5.1 mmol/L  Chloride 106 101 - 111 mmol/L   CO2 26 22 - 32 mmol/L   Glucose, Bld 100 (H) 65 - 99 mg/dL   BUN 9 6 - 20 mg/dL   Creatinine, Ser 0.66 0.44 - 1.00 mg/dL   Calcium 8.9 8.9 - 10.3 mg/dL   Total Protein 7.5 6.5 - 8.1 g/dL   Albumin 4.0 3.5 - 5.0 g/dL   AST 13 (L) 15 - 41 U/L   ALT 9 (L) 14 - 54 U/L   Alkaline Phosphatase 46 38 - 126 U/L   Total Bilirubin 0.3 0.3 - 1.2 mg/dL   GFR calc non Af Amer >60 >60 mL/min   GFR calc Af Amer >60 >60 mL/min    Comment: (NOTE) The eGFR has been calculated using the CKD EPI equation. This calculation has not been validated in all clinical situations. eGFR's persistently <90 mL/min signify possible Chronic Kidney Disease.    Anion gap 7 5 - 15  Ethanol (ETOH)     Status: None   Collection Time: 04/11/15  7:41 AM  Result Value Ref Range   Alcohol, Ethyl (B) <5 <5 mg/dL    Comment:        LOWEST DETECTABLE LIMIT FOR SERUM ALCOHOL IS 11 mg/dL FOR MEDICAL PURPOSES ONLY   Salicylate level     Status: None   Collection Time: 04/11/15  7:41 AM  Result Value Ref Range   Salicylate Lvl <4.7 2.8 - 30.0 mg/dL  Pregnancy, urine POC     Status: None   Collection Time: 04/11/15  9:10 AM  Result Value Ref Range   Preg Test, Ur NEGATIVE NEGATIVE    Comment:         THE SENSITIVITY OF THIS METHODOLOGY IS >24 mIU/mL   Urine Drug Screen, Qualitative [facility])     Status: Abnormal   Collection Time: 04/11/15  9:10 AM  Result Value Ref Range   Tricyclic, Ur Screen NONE DETECTED NONE DETECTED   Amphetamines, Ur Screen NONE DETECTED NONE DETECTED   MDMA (Ecstasy)Ur Screen NONE DETECTED NONE DETECTED   Cocaine Metabolite,Ur [place] NONE DETECTED NONE DETECTED   Opiate, Ur Screen NONE DETECTED NONE DETECTED   Phencyclidine (PCP) Ur S NONE DETECTED NONE DETECTED   Cannabinoid 50 Ng, Ur [place] POSITIVE (A) NONE DETECTED   Barbiturates, Ur Screen NONE DETECTED NONE DETECTED   Benzodiazepine, Ur Scrn NONE DETECTED NONE DETECTED   Methadone Scn, Ur NONE DETECTED NONE DETECTED    Comment: (NOTE) 841  Tricyclics, urine               Cutoff 1000 ng/mL 200  Amphetamines, urine             Cutoff 1000 ng/mL 300  MDMA (Ecstasy), urine           Cutoff 500 ng/mL 400  Cocaine Metabolite, urine       Cutoff 300 ng/mL 500  Opiate, urine                   Cutoff 300 ng/mL 600  Phencyclidine (PCP), urine      Cutoff 25 ng/mL 700  Cannabinoid, urine              Cutoff 50 ng/mL 800  Barbiturates, urine             Cutoff 200 ng/mL 900  Benzodiazepine, urine           Cutoff 200 ng/mL 1000 Methadone, urine  Cutoff 300 ng/mL 1100 1200 The urine drug screen provides only a preliminary, unconfirmed 1300 analytical test result and should not be used for non-medical 1400 purposes. Clinical consideration and professional judgment should 1500 be applied to any positive drug screen result due to possible 1600 interfering substances. A more specific alternate chemical method 1700 must be used in order to obtain a confirmed analytical result.  1800 Gas chromato graphy / mass spectrometry (GC/MS) is the preferred 1900 confirmatory method.     Physical Findings: AIMS: Facial and Oral Movements Muscles of Facial Expression: None, normal Lips and Perioral Area: None,  normal Jaw: None, normal Tongue: None, normal,Extremity Movements Upper (arms, wrists, hands, fingers): None, normal Lower (legs, knees, ankles, toes): None, normal, Trunk Movements Neck, shoulders, hips: None, normal, Overall Severity Severity of abnormal movements (highest score from questions above): None, normal Incapacitation due to abnormal movements: None, normal Patient's awareness of abnormal movements (rate only patient's report): No Awareness, Dental Status Current problems with teeth and/or dentures?: No Does patient usually wear dentures?: No  CIWA:    COWS:      See Psychiatric Specialty Exam and Suicide Risk Assessment completed by Attending Physician prior to discharge.  Discharge destination:  Home  Is patient on multiple antipsychotic therapies at discharge:  No   Has Patient had three or more failed trials of antipsychotic monotherapy by history:  No    Recommended Plan for Multiple Antipsychotic Therapies: NA  Discharge Instructions    Diet - low sodium heart healthy    Complete by:  As directed      Increase activity slowly    Complete by:  As directed             Medication List    TAKE these medications      Indication   cyclobenzaprine 5 MG tablet  Commonly known as:  FLEXERIL  Take 5 mg by mouth 3 (three) times daily as needed for muscle spasms.      ibuprofen 800 MG tablet  Commonly known as:  ADVIL,MOTRIN  Take 800 mg by mouth 2 (two) times daily as needed for mild pain.          Follow-up recommendations:  Activity:  as tolerated Diet:  low sodium heart healthy Other:  keep your aqppointments.  Total Discharge Time: 47  Signed: Orson Slick 04/12/2015, 5:12 PM

## 2015-04-12 NOTE — [facility] Suicide Risk Assessment (Deleted)
[facility]   Nursing information obtained from:  Patient Demographic factors:  Unemployed Current Mental Status:    Loss Factors:  Financial problems / change in socioeconomic status Historical Factors:  Family history of mental illness or substance abuse Risk Reduction Factors:  Responsible for children under 33 years of age, Sense of responsibility to family, Living with another person, especially a relative, Positive social support Total Time spent with patient: 1 hour Principal Problem: Bipolar 1 disorder, depressed, severe Diagnosis:   Patient Active Problem List   Diagnosis Date Noted  . Bipolar 1 disorder, manic, moderate [F31.12] 04/12/2015  . Tobacco use disorder [Z72.0] 04/12/2015  . Accelerated hypertension [I10] 04/12/2015  . Bipolar 1 disorder, depressed, severe [F31.4] 04/11/2015     Continued Clinical Symptoms:  Alcohol Use Disorder Identification Test Final Score (AUDIT): 0 The "Alcohol Use Disorders Identification Test", Guidelines for Use in Primary Care, Second Edition.  World Science writerHealth Organization [facility](WHO). Score between 0-7:  no or low risk or alcohol related problems. Score between 8-15:  moderate risk of alcohol related problems. Score between 16-19:  high risk of alcohol related problems. Score 20 or above:  warrants further diagnostic evaluation for alcohol dependence and treatment.   CLINICAL FACTORS:   Bipolar Disorder:   Depressive phase   Musculoskeletal: Strength & Muscle Tone: within normal limits Gait & Station: normal Patient leans: N/A  Psychiatric Specialty Exam: Physical Exam  ROS Review of Systems - Negative except abdominal pain with menstruation and back pain.  Blood pressure 177/116, pulse 63, temperature 98.8 F (37.1 C), temperature source Oral, resp. rate 20, height 5\' 5"  (1.651 m), weight 81.647 kg (180 lb), last menstrual period 04/11/2015, SpO2 99 %.Body mass index is 29.95 kg/(m^2).  General Appearance:  Casual, Neat and Well Groomed  Eye Contact::  Good  Speech:  Normal Rate  Volume:  Normal  Mood:  Euthymic  Affect:  anxious.  Thought Process:  Linear and Logical  Orientation:  Full (Time, Place, and Person)  Thought Content:  WDL  Suicidal Thoughts:  No  Homicidal Thoughts:  No  Memory:  Immediate;   Fair Recent;   Fair Remote;   Fair  Judgement:  Fair  Insight:  Fair  Psychomotor Activity:  Normal  Concentration:  Fair  Recall:  [employer]Fair  Fund of Knowledge:Fair  Language: Fair  Akathisia:  No  Handed:  Right  AIMS (if indicated):     Assets:  Communication Skills Desire for Improvement Financial Resources/Insurance Housing Physical Health Resilience Social Support Transportation Others:  responsible for children, loving wife.  Sleep:     Cognition: WNL  ADL's:  Intact     COGNITIVE FEATURES THAT CONTRIBUTE TO RISK:  None    SUICIDE RISK:   Minimal: No identifiable suicidal ideation.  Patients presenting with no risk factors but with morbid ruminations; may be classified as minimal risk based on the severity of the depressive symptoms. Family meeting completed prior to discharge.  PLAN OF CARE: [name] will follow up with [facility] for medication mangment and therapy. She will work with peer support as well.   Medical Decision Making:  Established Problem, Stable/Improving (1)  [name] declines psychotropic medications but is open to therapy.  I certify that inpatient services furnished can reasonably be expected to improve the patient's condition.   Kristine LineaUCILOWSKA, Adali Pennings 04/12/2015, 4:58 PM
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
## 2015-04-12 NOTE — [facility] Group Notes (Signed)
[facility] Group Notes:  (Nursing/MHT/Case Management/Adjunct)  Date:  04/12/2015  Time:  12:11 PM  Type of Therapy:  Group Therapy  Participation Level:  Did Not Attend  Participation Quality:  Resistant  Affect:  Resistant  Cognitive:  Lacking  Insight:  None  Engagement in Group:  Resistant  Modes of Intervention:  Education  Summary of Progress/Problems:  Mickey Farberamela M Moses Ellison 04/12/2015, 12:11 PM

## 2015-04-12 NOTE — ED Notes (Signed)
Pt asleep in bed at this time. No unusual behavior observed. Pt has no needs or concerns at this time. Will continue to monitor and f/u as needed.

## 2015-04-12 NOTE — Plan of Care (Signed)
Problem: [facility] Participation in Recreation Therapeutic Interventions Goal: STG-Other Recreation Therapy Goal (Specify) STG: Time Management - Within 4 treatment sessions, patient will demonstrate how to use a schedule in each of 2 treatment sessions to increase time management skills post d/c.  Outcome: Progressing Treatment session 1: At approximately 1:30 pm, LRT met with patient in patient room. Patient demonstrated how she would use the schedule. LRT provided suggestions for patient as well. Patient requested for a list of activities she could do with her family. LRT will provided patient with the list later on today.  Leonette [facility], LRT/CTRS 05.03.16 2:12 pm

## 2015-04-12 NOTE — ED Notes (Signed)
This RN spoke with Dr.Sung about pt's request for medication for back pain. Medication ordered. Pt in her room lying in bed with eyes closed at this time. If pt awakens and requests medication, medication will be administered at that time.

## 2015-04-12 NOTE — Progress Notes (Signed)
Recreation Therapy Notes  INPATIENT RECREATION THERAPY ASSESSMENT  Patient Details Name: Anna Harris MRN: [id number] DOB: 01/11/1981 Today's Date: 04/12/2015  Patient Stressors: Relationship, Death  Coping Skills:   Substance Abuse, Exercise, Art/Dance, Talking, Music, Other (Comment) (relax, think positive, nap, rethink and process)  Personal Challenges: Concentration, Decision-Making, Social Interaction, Time Management, Trusting Others  Leisure Interests (2+):  Individual - Other (Comment), Music - Listen (read)  Awareness of Community Resources:  Yes  Community Resources:  [employer], [employer]  Current Use: Yes  If no, Barriers?:    Patient Strengths:  Empathy for others, compassion for others  Patient Identified Areas of Improvement:  time management   Current Recreation Participation:  boring life, take care of kids, help with homework, make dinner  Patient Goal for Hospitalization:  "I don't know" "To go home and see family and continue to live life as best I can."  [place]  County [place] Residence:  [place]   Current SI (including self-harm):  No  Current HI:  No  Consent to Intern Participation: N/A   Jacquelynn Creelizabeth M Brock Larmon, LRT/CTRS 04/12/2015, 11:56 AM

## 2015-04-12 NOTE — [facility] Suicide Risk Assessment (Signed)
[facility]   Demographic Factors:  Unemployed  Total Time spent with patient: 1 hour  Musculoskeletal: Strength & Muscle Tone: within normal limits Gait & Station: normal Patient leans: N/A  Psychiatric Specialty Exam: Physical Exam  ROS Review of Systems - Negative except abdominal pain during menstruation and back pain.  Blood pressure 177/116, pulse 63, temperature 98.8 F (37.1 C), temperature source Oral, resp. rate 20, height 5\' 5"  (1.651 m), weight 81.647 kg (180 lb), last menstrual period 04/11/2015, SpO2 99 %.Body mass index is 29.95 kg/(m^2).    See SRA.                                            Sleep:     Cognition: WNL  ADL's:  Intact   Have you used any form of tobacco in the last 30 days? (Cigarettes, Smokeless Tobacco, Cigars, and/or Pipes): Yes  Has this patient used any form of tobacco in the last 30 days? (Cigarettes, Smokeless Tobacco, Cigars, and/or Pipes) Yes, A prescription for an FDA-approved tobacco cessation medication was offered at discharge and the patient refused  Mental Status Per Nursing Assessment::   On Admission:     Current Mental Status by Physician: NA  Loss Factors: NA  Historical Factors: h/o cutting  Risk Reduction Factors:   Responsible for children under 118 years of age, Sense of responsibility to family, Religious beliefs about death, Living with another person, especially a relative, Positive social support, Positive therapeutic relationship and Positive coping skills or problem solving skills  Continued Clinical Symptoms:  Bipolar Disorder:   Depressive phase  Cognitive Features That Contribute To Risk:  None    Suicide Risk:  Minimal: No identifiable suicidal ideation.  Patients presenting with no risk factors but with morbid ruminations; may be classified as minimal risk based on the severity of the depressive symptoms  Principal Problem: Bipolar 1 disorder, depressed,  severe Discharge Diagnoses:  Patient Active Problem List   Diagnosis Date Noted  . Bipolar 1 disorder, manic, moderate [F31.12] 04/12/2015  . Tobacco use disorder [Z72.0] 04/12/2015  . Accelerated hypertension [I10] 04/12/2015  . Cannabis abuse [F12.10] 04/12/2015  . Bipolar 1 disorder, depressed, severe [F31.4] 04/11/2015    Follow-up Information    Schedule an appointment as soon as possible for a visit with [facility].   Why:  Hospital Follow up   Contact information:   [address] [place] [address] [phone]; [phone]      Plan Of Care/Follow-up recommendations:  Activity:  as tolerated. Diet:  low sodium heart healthy. Other:  follow up with your appointments.  Is patient on multiple antipsychotic therapies at discharge:  No   Has Patient had three or more failed trials of antipsychotic monotherapy by history:  No  Recommended Plan for Multiple Antipsychotic Therapies: NA    Briani Maul 04/12/2015, 5:32 PM

## 2015-04-13 NOTE — Progress Notes (Signed)
Patient ID: Anna Harris, female   DOB: [birth year], 34 y.o.   MRN: [id number]  Family Session SW met with Pt and her husband Anna Harris.  Pt discussed her reasons for admission to [facility] and her feelings regarding her husbands recent illness. She expressed her needs for increased emotional support and help with in the  Home.  They talked about their roles and needs.  Pt and husband were able to come to an agreement that he would follow Dr. Recommendations.  He verbalizes that he was not aware of how much not addressing his health had concerned her and created extreme anxiety.  Both parties were considerate of one another, Pt tearful at times and both expressing a willingness to communicate better and make their lives and marriage work for the long term.  SW provided Suicide Prevention Education to Pt's husband.

## 2015-04-13 NOTE — [facility] Suicide Risk Assessment (Signed)
[facility] INPATIENT:  Family/Significant Other Suicide Prevention Education  Suicide Prevention Education:  Education Completed; Anna Harris, Husband,  (name of family member/significant other) has been identified by the patient as the family member/significant other with whom the patient will be residing, and identified as the person(s) who will aid the patient in the event of a mental health crisis (suicidal ideations/suicide attempt).  With written consent from the patient, the family member/significant other has been provided the following suicide prevention education, prior to the and/or following the discharge of the patient.  The suicide prevention education provided includes the following:  Suicide risk factors  Suicide prevention and interventions  National Suicide Hotline telephone number  [facility] assessment telephone number  [facility] Emergency Assistance 911  [facility] and/or Residential Mobile Crisis Unit telephone number  Request made of family/significant other to:  Remove weapons (e.g., guns, rifles, knives), all items previously/currently identified as safety concern.    Remove drugs/medications (over-the-counter, prescriptions, illicit drugs), all items previously/currently identified as a safety concern.  The family member/significant other verbalizes understanding of the suicide prevention education information provided.  The family member/significant other agrees to remove the items of safety concern listed above.  Anna Harris, Anna Harris P 04/13/2015, 3:52 PM

## 2015-04-13 NOTE — Progress Notes (Signed)
  [facility] Discharge Plan :  Will you be returning to the same living situation after discharge:  Yes,  Home with Husband and Children At discharge, do you have transportation home?: Yes,  Car in parking lot Do you have the ability to pay for your medications: Yes,  Medicaid  Release of information consent forms completed and in the chart;  Patient's signature needed at discharge.  Patient to Follow up at: Follow-up Information    Schedule an appointment as soon as possible for a visit with [facility].   Why:  Hospital Follow up   Contact information:   [address] [place] [address] [phone]; fax-[phone]      Patient denies SI/HI: Yes,     Safety Planning and Suicide Prevention discussed: Yes,  completed with Pt's husband  Have you used any form of tobacco in the last 30 days? (Cigarettes, Smokeless Tobacco, Cigars, and/or Pipes): Yes  Has patient been referred to the Quitline?: Patient refused referral  [name], Shaughnessy Gethers P 04/13/2015, 3:59 PM

## 2015-04-13 NOTE — Progress Notes (Signed)
Recreation Therapy Notes  INPATIENT RECREATION TR PLAN  Patient Details Name: Anna Harris MRN: [id number] DOB: Jul 13, 1981 Today's Date: 04/13/2015  Rec Therapy Plan Is patient appropriate for Therapeutic Recreation?: Yes Treatment times per week: At least 3 times a week TR Treatment/Interventions: 1:1 session, Group participation (Comment) (Appropriate participation in daily recreation therapy tx)  Discharge Criteria Pt will be discharged from therapy if:: Discharged Treatment plan/goals/alternatives discussed and agreed upon by:: Patient/family  Discharge Summary Short term goals set: Within 4 treatment sessions, patient will demonstrate how to use schedule in each of 2 treatment sessions to increase time management skills posst d/c. Short term goals met: Adequate for discharge (Patient demonstrated 1 out of 2 times.) Progress toward goals comments: One-to-one attended One-to-one attended: Time management Reason goals not met: Patient d/c before goal could be met Therapeutic equipment acquired: None Reason patient discharged from therapy: Discharge from hospital Pt/family agrees with progress & goals achieved: Yes Date patient discharged from therapy: 04/13/15   Leonette Monarch, LRT/CTRS 04/13/2015, 11:08 AM

## 2016-03-08 ENCOUNTER — Encounter: Payer: Self-pay | Admitting: Podiatry

## 2016-03-08 ENCOUNTER — Ambulatory Visit (INDEPENDENT_AMBULATORY_CARE_PROVIDER_SITE_OTHER): Payer: Medicare HMO

## 2016-03-08 ENCOUNTER — Ambulatory Visit (INDEPENDENT_AMBULATORY_CARE_PROVIDER_SITE_OTHER): Payer: Medicare HMO | Admitting: Podiatry

## 2016-03-08 VITALS — BP 165/99 | HR 78 | Resp 18

## 2016-03-08 DIAGNOSIS — R52 Pain, unspecified: Secondary | ICD-10-CM

## 2016-03-08 DIAGNOSIS — M2010 Hallux valgus (acquired), unspecified foot: Secondary | ICD-10-CM | POA: Diagnosis not present

## 2016-03-08 NOTE — Progress Notes (Signed)
Subjective:    Patient ID: Anna Harris, female    DOB: 12/19/1980, 35 y.o.   MRN: [id number]  HPI   35 year old female presents the office today for concerns of bilateral bunions with the right side worse than the left. She states that she has pain on a daily basis mostly with shoe gear and pressure when she does walking and standing. She has tried multiple treatment options including shoe gear modifications, offloading padding without any relief of symptoms. At this time should discuss surgical intervention to help decrease her pain. No other complaints at this time.  Review of Systems  All other systems reviewed and are negative.      Objective:   Physical Exam General: AAO x3, NAD  Dermatological: Skin is warm, dry and supple bilateral. Nails x 10 are well manicured; remaining integument appears unremarkable at this time. There are no open sores, no preulcerative lesions, no rash or signs of infection present.  Vascular: Dorsalis Pedis artery and Posterior Tibial artery pedal pulses are 2/4 bilateral with immedate capillary fill time. Pedal hair growth present. No varicosities and no lower extremity edema present bilateral. There is no pain with calf compression, swelling, warmth, erythema.   Neruologic: Grossly intact via light touch bilateral. Vibratory intact via tuning fork bilateral. Protective threshold with Semmes Wienstein monofilament intact to all pedal sites bilateral. Patellar and Achilles deep tendon reflexes 2+ bilateral. No Babinski or clonus noted bilateral.   Musculoskeletal: There is moderate HAV present bilaterally the right side worse than left. There is no pain or crepitation with first MTPJ range of motion. There is tenderness on the medial aspect of the first metatarsal head upon the bunion. There is mild erythema along this area from irritation in shoe gear. There is no mobility present. There is no other areas of tenderness to bilateral lower extremity is.  MMT 5/5, ROM WNL.   Gait: Unassisted, Nonantalgic.      Assessment & Plan:  35 year old female with bilateral bunions -Treatment options discussed including all alternatives, risks, and complications -Etiology of symptoms were discussed -X-rays were obtained and reviewed with the patient. HAV present. -I discussed both conservative and surgical treatment options. With just proceed with surgical treatment. -I discussed with her Austin bunionectomy with screw fixation. -The incision placement as well as the postoperative course was discussed with the patient. I discussed risks of the surgery which include, but not limited to, infection, bleeding, pain, swelling, need for further surgery, delayed or nonhealing, painful or ugly scar, numbness or sensation changes, over/under correction, recurrence, transfer lesions, further deformity, hardware failure, DVT/PE, loss of toe/foot. Patient understands these risks and wishes to proceed with surgery. The surgical consent was reviewed with the patient all 3 pages were signed. No promises or guarantees were given to the outcome of the procedure. All questions were answered to the best of my ability. Before the surgery the patient was encouraged to call the office if there is any further questions. The surgery will be performed at the [facility] on an outpatient basis. -I long discussion the patient in regards to her medical history in that smoking can greatly increase her chances and nonhealing and leadings amputation. I will order vascular studies to ensure adequate circulation prior to surgery. Also discuss her other medical conditions that she states that she is stable. She states that because of her foot pain this is was causing majority of her limiting factors with her medical conditions but she feels that overall she is stable  and has a good support system.  Ovid CurdMatthew Wagoner, DPM

## 2016-03-08 NOTE — Patient Instructions (Signed)
Bunionectomy A bunionectomy is a surgical procedure to remove a bunion. A bunion is a visible bump of bone on the inside of your foot where your big toe meets the rest of your foot. A bunion can develop when pressure turns this bone (first metatarsal) toward the other toes. Shoes that are too tight are the most common cause of bunions. Bunions can also be caused by diseases, such as arthritis and polio. You may need a bunionectomy if your bunion is very large and painful or it affects your ability to walk. LET YOUR HEALTH CARE PROVIDER KNOW ABOUT:  Any allergies you have.  All medicines you are taking, including vitamins, herbs, eye drops, creams, and over-the-counter medicines.  Previous problems you or members of your family have had with the use of anesthetics.  Any blood disorders you have.  Previous surgeries you have had.  Medical conditions you have. RISKS AND COMPLICATIONS  Generally, this is a safe procedure. However, problems may occur, including:  Infection.  Pain.  Nerve damage.  Bleeding or blood clots.  Reactions to medicines.  Numbness, stiffness, or arthritis in your toe.  Foot problems that continue even after the procedure. BEFORE THE PROCEDURE  Ask your health care provider about:  Changing or stopping your regular medicines. This is especially important if you are taking diabetes medicines or blood thinners.  Taking medicines such as aspirin and ibuprofen. These medicines can thin your blood. Do not take these medicines before your procedure if your health care provider instructs you not to.  Do not drink alcohol before the procedure as directed by your health care provider.  Do not use tobacco products, including cigarettes, chewing tobacco, or electronic cigarettes, before the procedure as directed by your health care provider. If you need help quitting, ask your health care provider.  Ask your health care provider what kind of medicine you will be  given during your procedure. A bunionectomy may be done using one of these:  A medicine that numbs the area (local anesthetic).  A medicine that makes you go to sleep (general anesthetic). If you will be given general anesthetic, do not eat or drink anything after midnight on the night before the procedure or as directed by your health care provider. PROCEDURE  An IV tube may be inserted into a vein.  You will be given local anesthetic or general anesthetic.  The surgeon will make a cut (incision) over the enlarged area at the first joint of the big toe. The surgeon will remove the bunion.  You may have more than one incision if any of the bones in your big toe need to be moved. A bone itself may need to be cut.  Sometimes the tissues around the big toe may also need to be cut then tightened or loosened to reposition the toe.  Screws or other hardware may be used to keep your foot in thecorrect position.  The incision will be closed with stitches (sutures) and covered with adhesive strips or another type of bandage (dressing). AFTER THE PROCEDURE  You may spend some time in a recovery area.  Your blood pressure, heart rate, breathing rate, and blood oxygen level will be monitored often until the medicines you were given have worn off.   This information is not intended to replace advice given to you by your health care provider. Make sure you discuss any questions you have with your health care provider.   Document Released: 11/09/2005 Document Revised: 08/17/2015 Document Reviewed: 07/14/2014   Elsevier Interactive Patient Education [employer]2016 Elsevier Inc.    Pre-Operative Instructions  Congratulations, you have decided to take an important step to improving your quality of life.  You can be assured that the doctors of [facility] will be with you every step of the way.   Plan to be at the surgery center/hospital at least 1 (one) hour prior to your scheduled time unless  otherwise directed by the surgical center/hospital staff.  You must have a responsible adult accompany you, remain during the surgery and drive you home.  Make sure you have directions to the surgical center/hospital and know how to get there on time.  For hospital based surgery you will need to obtain a history and physical form from your family physician within 1 month prior to the date of surgery- we will give you a form for you primary physician.   We make every effort to accommodate the date you request for surgery.  There are however, times where surgery dates or times have to be moved.  We will contact you as soon as possible if a change in schedule is required.    No Aspirin/Ibuprofen for one week before surgery.  If you are on aspirin, any non-steroidal anti-inflammatory medications (Mobic, Aleve, Ibuprofen) you should stop taking it 7 days prior to your surgery.  You make take Tylenol  For pain prior to surgery.   Medications- If you are taking daily heart and blood pressure medications, seizure, reflux, allergy, asthma, anxiety, pain or diabetes medications, make sure the surgery center/hospital is aware before the day of surgery so they may notify you which medications to take or avoid the day of surgery.  No food or drink after midnight the night before surgery unless directed otherwise by surgical center/hospital staff.  No alcoholic beverages 24 hours prior to surgery.  No smoking 24 hours prior to or 24 hours after surgery.  Wear loose pants or shorts- loose enough to fit over bandages, boots, and casts.  No slip on shoes, sneakers are best.  Bring your boot with you to the surgery center/hospital.  Also bring crutches or a walker if your physician has prescribed it for you.  If you do not have this equipment, it will be provided for you after surgery.  If you have not been contracted by the surgery center/hospital by the day before your surgery, call to confirm the date and time  of your surgery.  Leave-time from work may vary depending on the type of surgery you have.  Appropriate arrangements should be made prior to surgery with your employer.  Prescriptions will be provided immediately following surgery by your doctor.  Have these filled as soon as possible after surgery and take the medication as directed.  Remove nail polish on the operative foot.  Wash the night before surgery.  The night before surgery wash the foot and leg well with the antibacterial soap provided and water paying special attention to beneath the toenails and in between the toes.  Rinse thoroughly with water and dry well with a towel.  Perform this wash unless told not to do so by your physician.  Enclosed: 1 Ice pack (please put in freezer the night before surgery)   1 Hibiclens skin cleaner   Pre-op Instructions  If you have any questions regarding the instructions, do not hesitate to call our office at any point during this process.   [place]: [address] [place], [place] [address] [phone]  [place]: [phone]  [address] [place], [place] [address] [phone]  Dr. Ovid Curd, DPM

## 2016-03-14 ENCOUNTER — Telehealth: Payer: Self-pay | Admitting: *Deleted

## 2016-03-14 DIAGNOSIS — R0989 Other specified symptoms and signs involving the circulatory and respiratory systems: Secondary | ICD-10-CM

## 2016-03-14 NOTE — Telephone Encounter (Signed)
I left a message for patient to call me with her mother/ grandmother.  I need to get updated insurance information.  She doesn't have Medicare.  May be [employer] which will require authorization.

## 2016-03-14 NOTE — Telephone Encounter (Addendum)
-----   Message from Enedina Finnerelydia J Meadows sent at 03/14/2016  3:59 PM EDT -----   ----- Message -----    From: Vivi BarrackMatthew R Wagoner, DPM    Sent: 03/08/2016   5:32 PM      To: Remo Lippselydia J Meadows  Val and Delydia-  This is a [facility] patient that I scheduled Thursday for surgery. Before surgery she needs vascular studies. She also needed medical clerance. She has bipolor and some other issues. She states she is not taking any medications for these issues because she is trying "homeopathic" medications and she is starting to trust [facility] again. I just want to make sure her PCP and/or psycharist is OK with surgery.   She also needs arterial studies (not sure if this is Val or Delydia). She smokes and I just want to ensure adequate circulation prior to surgery. 03/15/2016-Left message informing pt Dr. Ardelle AntonWagoner ordered circulation test for her feet and legs and she would be receiving a call from [facility], I left their appt line also.  Faxed.

## 2016-03-15 ENCOUNTER — Encounter: Payer: Self-pay | Admitting: *Deleted

## 2016-03-15 NOTE — Telephone Encounter (Signed)
I left patient a message to call me back regarding insurance card.

## 2016-03-16 ENCOUNTER — Encounter: Payer: Self-pay | Admitting: Podiatry

## 2016-03-20 NOTE — Telephone Encounter (Signed)
I called and left patient a message to call me.  We need to reschedule.

## 2016-03-20 NOTE — Telephone Encounter (Signed)
"  I'm returning your call."  Yes, I was calling to tell you we are going to have to postpone your surgery.  Dr. Ardelle AntonWagoner wants you to have medical clearance and we still haven't received pre[facility]certification for [facility].  "Okay, that is fine.  It must be divine intervention too much is happening.  I just read that I should stop vitamins and supplements 2 weeks prior to surgery and I been taking Ibuprofen, I'm on my menstrual cycle.  I was a little scared too so this is fine."  You go to get your physical in May correct?  "Yes, I go on May 23."  We can schedule after that on May 24 or 31.  "Let's do it on May 24.  I should have all my ducks in a row by then."  You will have to insure that they get a medical clearance letter to us right away.  "I will make sure because they have some other paperwork they will have to fill out for me too."  I called and informed Aram [facility] to reschedule her surgery from 03/21/2016 to 05/02/2016.

## 2016-03-20 NOTE — Telephone Encounter (Signed)
"  I was just calling to confirm my surgery time on the 12th.  Give me a call back."

## 2016-03-29 ENCOUNTER — Encounter: Payer: Medicare Other | Admitting: Podiatry

## 2016-05-01 ENCOUNTER — Telehealth: Payer: Self-pay | Admitting: *Deleted

## 2016-05-01 NOTE — Telephone Encounter (Signed)
I left patient a message to give me a call back.  I'm following up in regards to appointment with primary care physician scheduled for today.  We have to have clearance in order to proceed with procedure on tomorrow.  Give me a call back.

## 2016-05-01 NOTE — Telephone Encounter (Signed)
I left patient a message that we are going to have to cancel appointment for tomorrow.  Give me a call if you have any questions on tomorrow.  I called and canceled surgery with Aram Bee[facility]ynthia at [facility].

## 2016-05-07 DIAGNOSIS — K5900 Constipation, unspecified: Secondary | ICD-10-CM | POA: Insufficient documentation

## 2016-05-07 DIAGNOSIS — L80 Vitiligo: Secondary | ICD-10-CM | POA: Insufficient documentation

## 2016-05-10 ENCOUNTER — Encounter: Payer: Self-pay | Admitting: Podiatry

## 2016-05-21 DIAGNOSIS — R519 Headache, unspecified: Secondary | ICD-10-CM | POA: Insufficient documentation

## 2016-05-21 DIAGNOSIS — E569 Vitamin deficiency, unspecified: Secondary | ICD-10-CM | POA: Insufficient documentation

## 2016-05-21 DIAGNOSIS — E785 Hyperlipidemia, unspecified: Secondary | ICD-10-CM | POA: Insufficient documentation

## 2017-08-07 ENCOUNTER — Encounter: Payer: Medicare HMO | Admitting: Obstetrics and Gynecology

## 2017-08-13 ENCOUNTER — Ambulatory Visit (INDEPENDENT_AMBULATORY_CARE_PROVIDER_SITE_OTHER): Payer: Medicare HMO | Admitting: Obstetrics and Gynecology

## 2017-08-13 ENCOUNTER — Encounter: Payer: Self-pay | Admitting: Obstetrics and Gynecology

## 2017-08-13 VITALS — BP 152/91 | HR 83 | Ht 65.0 in | Wt 202.2 lb

## 2017-08-13 DIAGNOSIS — R102 Pelvic and perineal pain: Secondary | ICD-10-CM

## 2017-08-13 NOTE — Progress Notes (Signed)
HPI:      [name] is a 36 y.o. [phone] who LMP was Patient's last menstrual period was 08/09/2017 (approximate).  Subjective:   She presents today With complaint of cyclic pelvic pain. She says the pain comes on the day before her cycle and lasts through her menstrual bleeding into the next week. She says that she is often disabled by the pain causing her to lie in a dark room quietly. She says the pain has been present for the last 4 years. She describes seeing multiple doctors for multiple medical conditions including blood pressure, vitiligo, autoimmune disorder, bipolar disorder.   She also states that she smokes cigarettes but she needs to quit. [facility] was [facility] and they did not fix her pain. She states that she does not like to take medicines and usually they don't work for her because she has taken antihypertensives and pain medications without success.    Hx: The following portions of the patient's history were reviewed and updated as appropriate:             She  has a past medical history of Back pain; Bipolar 1 disorder (HCC); Dysmenorrhea; Hypertension; Sciatic pain; and Vitiligo. She  does not have any pertinent problems on file. She  has a past surgical history that includes Cesarean section and Tubal ligation. Her family history includes Diabetes in her father; Hypertension in her father and mother. She  reports that she has been smoking Cigarettes.  She has never used smokeless tobacco. She reports that she uses drugs, including Marijuana. She reports that she does not drink alcohol. She is allergic to sulfa antibiotics.       Review of Systems:  Review of Systems  Constitutional: Denied constitutional symptoms, night sweats, recent illness, fatigue, fever, insomnia and weight loss.  Eyes: Denied eye symptoms, eye pain, photophobia, vision change and visual disturbance.  Ears/Nose/Throat/Neck: Denied ear, nose, throat or neck  symptoms, hearing loss, nasal discharge, sinus congestion and sore throat.  Cardiovascular: Denied cardiovascular symptoms, arrhythmia, chest pain/pressure, edema, exercise intolerance, orthopnea and palpitations.  Respiratory: Denied pulmonary symptoms, asthma, pleuritic pain, productive sputum, cough, dyspnea and wheezing.  Gastrointestinal: Denied, gastro-esophageal reflux, melena, nausea and vomiting.  Genitourinary: See HPI for additional information.  Musculoskeletal: Denied musculoskeletal symptoms, stiffness, swelling, muscle weakness and myalgia.  Dermatologic: Denied dermatology symptoms, rash and scar.  Neurologic: Denied neurology symptoms, dizziness, headache, neck pain and syncope.  Psychiatric: Denied psychiatric symptoms, anxiety and depression.  Endocrine: Denied endocrine symptoms including hot flashes and night sweats.   Meds:   Current Outpatient Prescriptions on File Prior to Visit  Medication Sig Dispense Refill  . ibuprofen (ADVIL,MOTRIN) 800 MG tablet Take 800 mg by mouth 2 (two) times daily as needed for mild pain. Reported on 03/08/2016    . cyclobenzaprine (FLEXERIL) 5 MG tablet Take 5 mg by mouth 3 (three) times daily as needed for muscle spasms. Reported on 03/08/2016     No current facility-administered medications on file prior to visit.     Objective:     Vitals:   35/04/18 0809  BP: (!) 152/91  Pulse: 83              Patient declined examination today because she is "on her cycle"  Assessment:    [id number] Patient Active Problem List   Diagnosis Date Noted  . HAV (hallux abducto valgus) 03/08/2016  . Bipolar 1 disorder, manic, moderate (HCC) 04/12/2015  .  Tobacco use disorder 04/12/2015  . Accelerated hypertension 04/12/2015  . Cannabis abuse 04/12/2015  . Bipolar 1 disorder, depressed, severe (HCC) 04/11/2015     1. Pelvic pain in female     Chronic cyclic pelvic pain.  Possible uterine fibroids, possible pelvic adhesive disease, possible  endometriosis.   Plan:            1.  Pelvic ultrasound  2.  We have discussed multiple ways to control menstrual cycles and the patient is reticent about most of them. She seems enthusiastic when discussing birth control pills but when she was told she would have to stop smoking this was not further entertained. She outright declined the possible use of IUD.   Orders No orders of the defined types were placed in this encounter.   No orders of the defined types were placed in this encounter.     F/U  Return in about 2 weeks (around 08/27/2017). I spent 31 minutes with this patient of which greater than 50% was spent discussing pelvic pain, her previous medical conditions, workup and possible treatment of pelvic pain.  Elonda Husky, M.D. 08/13/2017 9:03 AM

## 2017-08-21 ENCOUNTER — Other Ambulatory Visit: Payer: Self-pay

## 2017-08-27 ENCOUNTER — Encounter: Payer: Self-pay | Admitting: Obstetrics and Gynecology

## 2017-08-30 ENCOUNTER — Ambulatory Visit (INDEPENDENT_AMBULATORY_CARE_PROVIDER_SITE_OTHER): Payer: Medicare HMO

## 2017-08-30 DIAGNOSIS — R102 Pelvic and perineal pain: Secondary | ICD-10-CM | POA: Diagnosis not present

## 2017-09-02 ENCOUNTER — Encounter: Payer: Self-pay | Admitting: Obstetrics and Gynecology

## 2017-10-09 ENCOUNTER — Ambulatory Visit (INDEPENDENT_AMBULATORY_CARE_PROVIDER_SITE_OTHER): Payer: Medicare HMO | Admitting: Obstetrics and Gynecology

## 2017-10-09 ENCOUNTER — Encounter: Payer: Self-pay | Admitting: Obstetrics and Gynecology

## 2017-10-09 VITALS — BP 178/123 | HR 82 | Ht 65.0 in | Wt 206.0 lb

## 2017-10-09 DIAGNOSIS — I10 Essential (primary) hypertension: Secondary | ICD-10-CM | POA: Diagnosis not present

## 2017-10-09 DIAGNOSIS — N9089 Other specified noninflammatory disorders of vulva and perineum: Secondary | ICD-10-CM

## 2017-10-09 DIAGNOSIS — R102 Pelvic and perineal pain: Secondary | ICD-10-CM

## 2017-10-09 LAB — POCT URINALYSIS DIPSTICK
Bilirubin, UA: NEGATIVE
Glucose, UA: NEGATIVE
Ketones, UA: NEGATIVE
Leukocytes, UA: NEGATIVE
NITRITE UA: NEGATIVE
Protein, UA: NEGATIVE
RBC UA: NEGATIVE
SPEC GRAV UA: 1.015 (ref 1.010–1.025)
Urobilinogen, UA: 0.2 E.U./dL
pH, UA: 6.5 (ref 5.0–8.0)

## 2017-10-09 NOTE — Progress Notes (Signed)
HPI:      [name] is a 36 y.o. [phone] who LMP was Patient's last menstrual period was 09/19/2017 (approximate).  Subjective:   She presents today stating that her pelvic pain is somewhat better.  She would like to find out the results of her ultrasound.  She did not return in a timely manner because "many things are occurring in her life and I am going through a bankruptcy".  She is complaining today of a recurrent "bump" on her left buttocks.  She says it is not a problem and does not hurt but it occasionally swells and becomes much larger. She reports that she has menstrual periods every single month.  She has not had irregular cycles throughout her life.  She had no difficulty getting pregnant prior to her tubal ligation.    Hx: The following portions of the patient's history were reviewed and updated as appropriate:             She  has a past medical history of Back pain; Bipolar 1 disorder (HCC); Dysmenorrhea; Hypertension; Sciatic pain; and Vitiligo. She  does not have any pertinent problems on file. She  has a past surgical history that includes Cesarean section and Tubal ligation. Her family history includes Diabetes in her father; Hypertension in her father and mother. She  reports that she has been smoking Cigarettes.  She has never used smokeless tobacco. She reports that she drinks alcohol. She reports that she uses drugs, including Marijuana. She is allergic to sulfa antibiotics.       Review of Systems:  Review of Systems  Constitutional: Denied constitutional symptoms, night sweats, recent illness, fatigue, fever, insomnia and weight loss.  Eyes: Denied eye symptoms, eye pain, photophobia, vision change and visual disturbance.  Ears/Nose/Throat/Neck: Denied ear, nose, throat or neck symptoms, hearing loss, nasal discharge, sinus congestion and sore throat.  Cardiovascular: Denied cardiovascular symptoms, arrhythmia, chest pain/pressure, edema, exercise  intolerance, orthopnea and palpitations.  Respiratory: Denied pulmonary symptoms, asthma, pleuritic pain, productive sputum, cough, dyspnea and wheezing.  Gastrointestinal: Denied, gastro-esophageal reflux, melena, nausea and vomiting.  Genitourinary: See HPI for additional information.  Musculoskeletal: Denied musculoskeletal symptoms, stiffness, swelling, muscle weakness and myalgia.  Dermatologic: Denied dermatology symptoms, rash and scar.  Neurologic: Denied neurology symptoms, dizziness, headache, neck pain and syncope.  Psychiatric: Denied psychiatric symptoms, anxiety and depression.  Endocrine: Denied endocrine symptoms including hot flashes and night sweats.   Meds:   Current Outpatient Prescriptions on File Prior to Visit  Medication Sig Dispense Refill  . ibuprofen (ADVIL,MOTRIN) 800 MG tablet Take 800 mg by mouth 2 (two) times daily as needed for mild pain. Reported on 03/08/2016     No current facility-administered medications on file prior to visit.     Objective:     Vitals:   10/09/17 0839  BP: (!) 178/123  Pulse: 82              Physical examination   Pelvic:   Vulva: Normal appearance.  On her left buttock/thigh it is possible to palpate a very deep very small irregularity.  She says that this is where this "bump" arises intermittently.  Vagina: No lesions or abnormalities noted.  Support: Normal pelvic support.  Urethra No masses tenderness or scarring.  Meatus Normal size without lesions or prolapse.  Cervix: Normal appearance.  No lesions.  Anus: Normal exam.  No lesions.  Perineum: Normal exam.  No lesions.        Bimanual  Uterus: Normal size.  Non-tender.  Mobile.  AV.  Adnexae: No masses.  Non-tender to palpation.  Cul-de-sac: Negative for abnormality.     Assessment:    [id number] Patient Active Problem List   Diagnosis Date Noted  . HAV (hallux abducto valgus) 03/08/2016  . Bipolar 1 disorder, manic, moderate (HCC) 04/12/2015  . Tobacco use  disorder 04/12/2015  . Accelerated hypertension 04/12/2015  . Cannabis abuse 04/12/2015  . Bipolar 1 disorder, depressed, severe (HCC) 04/11/2015     1. Pelvic pain in female   2. Essential hypertension   3. Vulvar lesion     Ultrasound reveals no significant abnormality-possible polycystic ovaries but after discussion PCO with the patient and documenting her regular cycles and pregnancy history we have decided upon no further hormonal testing at this time.  The bump on her buttocks is likely a sebaceous gland cyst that comes and goes.  We have discussed her hypertension in great detail and I have strongly advised her to see her family physician for management.  Like many things she has a number of excuses why she has not done this including the fact that she is busy, is living through a stressful time in her life, and antihypertensive do not work for her.  We have discussed all of these things in detail and I have made it very clear that this is a problem that she should have managed as soon as possible.  She understands this now.   Plan:            1.  Patient to contact us if the "bump" becomes worse and she would like to have it examined and possibly lanced.  2.  Patient to seek immediate treatment for hypertension.  Orders No orders of the defined types were placed in this encounter.   No orders of the defined types were placed in this encounter.     F/U  Return for Pt to contact us if symptoms worsen. I spent 21 minutes with this patient of which greater than 50% was spent discussing hypertension, vulvar lesion, pelvic pain, menstrual history.  Elonda Husky, M.D. 10/09/2017 9:31 AM

## 2019-08-19 DIAGNOSIS — L7211 Pilar cyst: Secondary | ICD-10-CM | POA: Diagnosis not present

## 2019-08-26 DIAGNOSIS — L720 Epidermal cyst: Secondary | ICD-10-CM | POA: Diagnosis not present

## 2019-09-02 DIAGNOSIS — L80 Vitiligo: Secondary | ICD-10-CM | POA: Diagnosis not present

## 2019-09-02 DIAGNOSIS — Z872 Personal history of diseases of the skin and subcutaneous tissue: Secondary | ICD-10-CM | POA: Diagnosis not present

## 2019-10-09 ENCOUNTER — Ambulatory Visit (INDEPENDENT_AMBULATORY_CARE_PROVIDER_SITE_OTHER): Payer: Medicare HMO | Admitting: Obstetrics and Gynecology

## 2019-10-09 ENCOUNTER — Other Ambulatory Visit (HOSPITAL_COMMUNITY)
Admission: RE | Admit: 2019-10-09 | Discharge: 2019-10-09 | Disposition: A | Discharge status: Home / Self Care | Payer: Medicare HMO | Source: Ambulatory Visit | Attending: Obstetrics and Gynecology | Admitting: Obstetrics and Gynecology

## 2019-10-09 ENCOUNTER — Encounter: Payer: Self-pay | Admitting: Obstetrics and Gynecology

## 2019-10-09 ENCOUNTER — Other Ambulatory Visit: Payer: Self-pay

## 2019-10-09 VITALS — BP 125/82 | HR 78 | Ht 65.0 in | Wt 204.5 lb

## 2019-10-09 DIAGNOSIS — N946 Dysmenorrhea, unspecified: Secondary | ICD-10-CM

## 2019-10-09 DIAGNOSIS — Z1151 Encounter for screening for human papillomavirus (HPV): Secondary | ICD-10-CM | POA: Insufficient documentation

## 2019-10-09 DIAGNOSIS — N943 Premenstrual tension syndrome: Secondary | ICD-10-CM | POA: Diagnosis not present

## 2019-10-09 DIAGNOSIS — F17200 Nicotine dependence, unspecified, uncomplicated: Secondary | ICD-10-CM

## 2019-10-09 DIAGNOSIS — Z124 Encounter for screening for malignant neoplasm of cervix: Secondary | ICD-10-CM

## 2019-10-09 DIAGNOSIS — I10 Essential (primary) hypertension: Secondary | ICD-10-CM | POA: Diagnosis not present

## 2019-10-09 DIAGNOSIS — Z1272 Encounter for screening for malignant neoplasm of vagina: Secondary | ICD-10-CM | POA: Insufficient documentation

## 2019-10-09 MED ORDER — IBUPROFEN 800 MG PO TABS: 800.0000 mg | ORAL_TABLET | Freq: Three times a day (TID) | ORAL | 0 refills | Status: AC | PRN

## 2019-10-09 MED ORDER — NICOTINE 10 MG IN INHA: 1.0000 | RESPIRATORY_TRACT | 0 refills | Status: DC | PRN

## 2019-10-09 NOTE — Progress Notes (Signed)
Pt present for menstruation pain and issues. Pt stated having extreme pain during her cycles to the point that she is unable to walk, pelvic pain, gas, bloating, lower back pain that feels like labor pains, during her cycle she is unable to eat due to stomach pain and other issues and unable to work during that time.

## 2019-10-09 NOTE — Patient Instructions (Signed)
Dysmenorrhea  Dysmenorrhea refers to cramps caused by the muscles of the uterus tightening (contracting) during a menstrual period. Dysmenorrhea may be mild, or it may be severe enough to interfere with everyday activities for a few days each month. Primary dysmenorrhea is menstrual cramps that last a couple of days when you start having menstrual periods or soon after. This often begins after a teenager starts having her period. As a woman gets older or has a baby, the cramps will usually lessen or disappear. Secondary dysmenorrhea begins later in life and is caused by a disorder in the reproductive system. It lasts longer, and it may cause more pain than primary dysmenorrhea. The pain may start before the period and last a few days after the period. What are the causes? Dysmenorrhea is usually caused by an underlying problem, such as:  The tissue that lines the uterus (endometrium) growing outside of the uterus in other areas of the body (endometriosis).  Endometrial tissue growing into the muscular walls of the uterus (adenomyosis).  Blood vessels in the pelvis becoming filled with blood just before the menstrual period (pelvic congestive syndrome).  Overgrowth of cells (polyps) in the endometrium or the lower part of the uterus (cervix).  The uterus dropping down into the vagina (prolapse) due to stretched or weak muscles.  Bladder problems, such as infection or inflammation.  Intestinal problems, such as a tumor or irritable bowel syndrome.  Cancer of the reproductive organs or bladder.  A severely tipped uterus.  A cervix that is closed or has a very small opening.  Noncancerous (benign) tumors of the uterus (fibroids).  Pelvic inflammatory disease (PID).  Pelvic scarring (adhesions) from a previous surgery.  An ovarian cyst.  An IUD (intrauterine device). What increases the risk? You are more likely to develop this condition if:  You are younger than age 30.  You  started puberty early.  You have irregular or heavy bleeding.  You have never given birth.  You have a family history of dysmenorrhea.  You smoke. What are the signs or symptoms? Symptoms of this condition include:  Cramping, throbbing pain, or a feeling of fullness in the lower abdomen.  Lower back pain.  Periods lasting for longer than 7 days.  Headaches.  Bloating.  Fatigue.  Nausea or vomiting.  Diarrhea.  Sweating or dizziness.  Loose stools. How is this diagnosed? This condition may be diagnosed based on:  Your symptoms.  Your medical history.  A physical exam.  Blood tests.  A Pap test. This is a test in which cells from the cervix are tested for signs of cancer or infection.  A pregnancy test.  Imaging tests, such as: ? Ultrasound. ? A procedure to remove and examine a sample of endometrial tissue (dilation and curettage, D&C). ? A procedure to visually examine the inside of:  The uterus (hysteroscopy).  The abdomen or pelvis (laparoscopy).  The bladder (cystoscopy).  The intestine (colonoscopy).  The stomach (gastroscopy). ? X-rays. ? CT scan. ? MRI. How is this treated? Treatment depends on the cause of the dysmenorrhea. Treatment may include:  Pain medicine prescribed by your health care provider.  Birth control pills that contain the hormone progesterone.  An IUD that contains the hormone progesterone.  Medicines to control bleeding.  Hormone replacement therapy.  NSAIDs. These may help to stop the production of hormones that cause cramps.  Antidepressant medicines.  Surgery to remove adhesions, endometriosis, ovarian cysts, fibroids, or the entire uterus (hysterectomy).  Injections of progesterone   to stop the menstrual period.  A procedure to destroy the endometrium (endometrial ablation).  A procedure to cut the nerves in the bottom of the spine (sacrum) that go to the reproductive organs (presacral neurectomy).  A  procedure to apply an electric current to nerves in the sacrum (sacral nerve stimulation).  Exercise and physical therapy.  Meditation and yoga therapy.  Acupuncture. Work with your health care provider to determine what treatment or combination of treatments is best for you. Follow these instructions at home: Relieving pain and cramping  Apply heat to your lower back or abdomen when you experience pain or cramps. Use the heat source that your health care provider recommends, such as a moist heat pack or a heating pad. ? Place a towel between your skin and the heat source. ? Leave the heat on for 20-30 minutes. ? Remove the heat if your skin turns bright red. This is especially important if you are unable to feel pain, heat, or cold. You may have a greater risk of getting burned. ? Do not sleep with a heating pad on.  Do aerobic exercises, such as walking, swimming, or biking. This can help to relieve cramps.  Massage your lower back or abdomen to help relieve pain. General instructions  Take over-the-counter and prescription medicines only as told by your health care provider.  Do not drive or use heavy machinery while taking prescription pain medicine.  Avoid alcohol and caffeine during and right before your menstrual period. These can make cramps worse.  Do not use any products that contain nicotine or tobacco, such as cigarettes and e-cigarettes. If you need help quitting, ask your health care provider.  Keep all follow-up visits as told by your health care provider. This is important. Contact a health care provider if:  You have pain that gets worse or does not get better with medicine.  You have pain with sex.  You develop nausea or vomiting with your period that is not controlled with medicine. Get help right away if:  You faint. Summary  Dysmenorrhea refers to cramps caused by the muscles of the uterus tightening (contracting) during a menstrual period.   Dysmenorrhea may be mild, or it may be severe enough to interfere with everyday activities for a few days each month.  Treatment depends on the cause of the dysmenorrhea.  Work with your health care provider to determine what treatment or combination of treatments is best for you. This information is not intended to replace advice given to you by your health care provider. Make sure you discuss any questions you have with your health care provider. Document Released: 11/26/2005 Document Revised: 11/08/2017 Document Reviewed: 12/29/2016 Elsevier Patient Education  2020 [employer]  

## 2019-10-09 NOTE — Progress Notes (Signed)
GYNECOLOGY PROGRESS NOTE  Subjective:    Patient ID: Anna Harris, female    DOB: March 25, 1981, 38 y.o.   MRN: [id number]  HPI  Patient is a 38 y.o. [phone] female who presents for moderate to severe dysmenorrhea/pelvic pain.  She reports that her symptoms have been ongoing for several years, since her tubal ligation. Has seen multiple doctors, but still has not received any relief. She had an ultrasound 1-2 years ago at [facility] which noted no significant findings. Reports she has tried Tylenol, NSAIDs (but they elevated her blood pressure as she has a h/o HTN and had to stop taking), and even Tramadol (but did not like it due to side effects).   Patient notes that she has heard a lot about an endometrial ablation as her sister had one and she no longer has periods, and would like to discuss.  Patient reports that her cycles are not heavy, usually light to moderate flow, lasting 4-5 days, but with severe dysmenorrhea. Notes significant vulvar irritation when using pads or tampons, so now just uses cloth or paper towels during cycles.    GYN History:  Patient's last menstrual period was 10/01/2019 (within days).  Menarche: 13 Last pap smear: patient cannot recall, not likely since the birth of her last child ~ 10 years ago. History of STI's: denies  Menstrual History:   Period Cycle (Days): 28 Period Duration (Days): 4-5 Period Pattern: Regular Menstrual Flow: Moderate Menstrual Control: Other (Comment) Menstrual Control Change Freq (Hours): 3-4 hours Dysmenorrhea: (!) Severe Dysmenorrhea Symptoms: Cramping, Headache, Nausea    OB History  Gravida Para Term Preterm AB Living  4 3 3   1 3   SAB TAB Ectopic Multiple Live Births  1       3    # Outcome Date GA Lbr Len/2nd Weight Sex Delivery Anes PTL Lv  4 Term 2010    M CS-Unspec   LIV  3 Term 2008    M CS-Unspec   LIV  2 Term 2006    M CS-Unspec   LIV  1 SAB 1997             The following portions of the patient's  history were reviewed and updated as appropriate:   She  has a past medical history of Back pain, Bipolar 1 disorder ([place]), Dysmenorrhea, Hypertension, Sciatic pain, and Vitiligo.   She  has a past surgical history that includes Cesarean section and Tubal ligation. Her family history includes Diabetes in her father; Hypertension in her father and mother.   She  reports that she has been smoking cigarettes. She has never used smokeless tobacco. She reports current alcohol use. She reports current drug use. Drug: Marijuana.   Current Outpatient Medications on File Prior to Visit  Medication Sig Dispense Refill  . hydrochlorothiazide (MICROZIDE) 12.5 MG capsule Take 12.5 mg by mouth daily.     No current facility-administered medications on file prior to visit.    She is allergic to sulfa antibiotics..  Review of Systems Pertinent items noted in HPI and remainder of comprehensive ROS otherwise negative.   Objective:   Blood pressure 125/82, pulse 78, height 5\' 5"  (1.651 m), weight 204 lb 8 oz (92.8 kg), last menstrual period 10/01/2019. General appearance: alert and no distress Abdomen: soft, non-tender; bowel sounds normal; no masses,  no organomegaly Pelvic: external genitalia normal, rectovaginal septum normal.  Vagina without discharge.  Cervix normal appearing, no lesions and no motion tenderness.  Uterus mobile,  nontender, normal shape and size.  Adnexae non-palpable, nontender bilaterally.  Extremities: extremities normal, atraumatic, no cyanosis or edema Neurologic: Grossly normal   Assessment:   Dysmenorrhea PMS Tobacco use disorder Essential hypertension Cervical cancer screening  Plan:  1. Discussion had with patient regarding her cycles and severe dysmenorrhea. Initially desired to discuss endometrial ablation as family members/friends have had this done and are happy with results. Advised patient that considering her most significant issue is pain, that it may or may  not improve with the ablation, even if her cycles stop, and she is at risk for tubal ligation-ablation syndrome.  Although patient has had 3 C-sections, less likely cause of her pain could be pelvic adhesive disease, but as symptoms are cyclical, and associated with PMS symptoms (headache, nausea),  is less likely cause. Discussed that her symptoms could be managed hormonally. Discussed options for management including progesterone tablets, IUD, Nexplanon, Depo Provera. Patient not a candidate for estrogen-containing products due to age, HTN (now better controlled than in the past), and smoking. Patient notes persistent bleeding with use of Depo Provera, and declines IUD. Concern for Nexplanon also causing irregular bleeding. Will try OCPs. Given sample of Slynd x 2 months. To begin after next cycle. Also discussed option of use of Diva Cup for menses, as she has had dermatitis reactions to use of pads and tampons, or can opt for natural/dye-free pads/tampons. Also advised that she can continue Tylenol, but can occasionally take Ibuprofen as needed for breakthrough pain.  Her BPs are better controlled with HCTZ, can take occasionally. Prescribed 800 mg tabs.  2. Tobacco abuse disorder - Assessed patient's readiness to stop smoking.  Notes that she would like to quit.  Has tried nicotine patches in the past but felt that they made her sick and "out of her head" as well as skin issues.  Discussed other options for management including "cut down" method, nicotine gum, medications such as Zyban or Chantix, cold [place].  Patient inquires about the inhaler.  Will prescribe. Also notes that her partner smokes.  Discussed methods to help both with cessation, or avoiding setbacks if he decides not to partner with her in cessation.  3. HTN - currently managed by PCP with HCTZ. Doing well.  4.  Patient has not had pap smear in ~ 10 years. Normal appearing cervix on exam. Will perform.   RTC in 1 month for smoking  cessation, and 3 months for f/u of dysmenorrhea after samples of Slynd completed.     A total of 25 minutes were spent face-to-face with the patient during this encounter and over half of that time involved counseling and coordination of care.   Hildred Laser, MD [facility]

## 2019-10-12 DIAGNOSIS — F17200 Nicotine dependence, unspecified, uncomplicated: Secondary | ICD-10-CM | POA: Diagnosis not present

## 2019-10-12 DIAGNOSIS — I10 Essential (primary) hypertension: Secondary | ICD-10-CM | POA: Diagnosis not present

## 2019-10-15 DIAGNOSIS — I10 Essential (primary) hypertension: Secondary | ICD-10-CM | POA: Diagnosis not present

## 2019-10-15 LAB — CYTOLOGY - PAP
Comment: NEGATIVE
Diagnosis: NEGATIVE
High risk HPV: NEGATIVE

## 2019-10-19 ENCOUNTER — Other Ambulatory Visit: Payer: Self-pay

## 2019-10-19 MED ORDER — KETOROLAC TROMETHAMINE 10 MG PO TABS: 10.0000 mg | ORAL_TABLET | Freq: Four times a day (QID) | ORAL | 0 refills | Status: AC | PRN

## 2019-10-19 NOTE — Progress Notes (Signed)
She can be called in Toradol to take for total of 5 days. Dose is 10 mg q 6 hrs.  She should not take Ibuprofen while using this, but can still use Tylenol for breakthrough pain.

## 2019-11-10 ENCOUNTER — Encounter: Payer: Medicare HMO | Admitting: Obstetrics and Gynecology

## 2019-11-24 ENCOUNTER — Telehealth: Payer: Self-pay

## 2019-11-24 ENCOUNTER — Other Ambulatory Visit: Payer: Self-pay | Admitting: Obstetrics and Gynecology

## 2019-11-24 NOTE — Telephone Encounter (Signed)
Patient has taken the first dose of the new birth control. Pain has decreased but flow has increased. Patient is on Day 9 as of today.Please call patient asap

## 2019-11-24 NOTE — Telephone Encounter (Signed)
Future medications should be sent to the [facility] on [address]. Notified patient that the other 4 meds prescribed was sent to a diff pharmacy. Patient agreed to by and pick up medication.

## 2019-11-25 NOTE — Telephone Encounter (Signed)
Please inform patient that the Nicotrol inhaler may not be covered by her insurance. Has she tried to pick up the medication? If it is too expensive, the alternatives would be Chantix or Zyban (both tablets). Please let us know which she would prefer as an alternative if the other is too expensive.   Dr. Marcelline Mates

## 2019-11-25 NOTE — Telephone Encounter (Signed)
error 

## 2019-11-27 NOTE — Telephone Encounter (Signed)
Spoke with pt concerning her bleeding and informed her that it was normal to have irregular cycles when first starting birth controls. Pt stated that her pain has decreased since she has been on the birth control. Pt was advised that if the bleeding has not decreased in 2 weeks to contact the office. Pt voiced that she understood.

## 2019-12-10 ENCOUNTER — Telehealth: Payer: Self-pay | Admitting: Obstetrics and Gynecology

## 2019-12-10 MED ORDER — SLYND 4 MG PO TABS: 4.0000 mg | ORAL_TABLET | Freq: Every day | ORAL | 10 refills | Status: AC

## 2019-12-10 NOTE — Telephone Encounter (Signed)
Pt is aware that an order for the birth control samples given by [facility] has been placed.

## 2019-12-10 NOTE — Telephone Encounter (Signed)
Pt called in pt needs are refill of her birth control from her 10/30 visit. Please advise

## 2019-12-18 ENCOUNTER — Telehealth: Payer: Self-pay

## 2019-12-18 NOTE — Telephone Encounter (Signed)
Pt is aware that her insurance will not cover Nicotrol oral inhaler. Pt was asked if there were any other medication that she was willing to try. Pt stated that she was unsure and will contact us after she speak to her PCP. Pt stated that she was unable to use the patches due to they were giving her headache, making her dizzy and lightheaded.

## 2020-01-07 NOTE — Patient Instructions (Addendum)

## 2020-01-07 NOTE — Progress Notes (Signed)
Pt present for 3 mo f/u for dysmenorrhea. Pt stated that she was doing well no problems.

## 2020-01-08 ENCOUNTER — Other Ambulatory Visit: Payer: Self-pay

## 2020-01-08 ENCOUNTER — Ambulatory Visit (INDEPENDENT_AMBULATORY_CARE_PROVIDER_SITE_OTHER): Payer: Medicare HMO | Admitting: Obstetrics and Gynecology

## 2020-01-08 ENCOUNTER — Encounter: Payer: Self-pay | Admitting: Obstetrics and Gynecology

## 2020-01-08 VITALS — BP 125/79 | HR 87 | Ht 65.0 in | Wt 211.0 lb

## 2020-01-08 DIAGNOSIS — N946 Dysmenorrhea, unspecified: Secondary | ICD-10-CM | POA: Diagnosis not present

## 2020-01-08 DIAGNOSIS — N943 Premenstrual tension syndrome: Secondary | ICD-10-CM | POA: Diagnosis not present

## 2020-01-08 DIAGNOSIS — N644 Mastodynia: Secondary | ICD-10-CM

## 2020-01-08 DIAGNOSIS — I10 Essential (primary) hypertension: Secondary | ICD-10-CM | POA: Diagnosis not present

## 2020-01-08 NOTE — Progress Notes (Signed)
    GYNECOLOGY PROGRESS NOTE  Subjective:    Patient ID: Anna Harris, female    DOB: January 06, 1981, 39 y.o.   MRN: [id number]  HPI  Patient is a 39 y.o. [phone] female who presents for3 month follow up of severe dysmenorrhea/pelvic pain s/p tubal ligation several years ago. Patient was initiated on Slynd, Ibuprofen/Toradol to help control her dysmenorrhea. She notes that her cycles are a lot less painful.  They have lengthened (went from 5 days to 9 days), however the last few days are light. She states that she only has to take the Toradol once or twice each cycle, but the rest of the time she can take Ibuprofen as needed.  Patient also desires to know how her blood pressures are today. Has a h/o HTN on meds (HCTZ), was elevated at last visit.  The following portions of the patient's history were reviewed and updated as appropriate: allergies, current medications, past family history, past medical history, past social history, past surgical history and problem list.   Review of Systems Pertinent items noted in HPI and remainder of comprehensive ROS otherwise negative.   Objective:   Blood pressure 125/79, pulse 87, height 5\' 5"  (1.651 m), weight 211 lb (95.7 kg). General appearance: alert and no distress Remainder of exam deferred.   Assessment:   Severe dysmenorrhea HTN  Plan:   - Severe dysmenorrhea managed with Slynd and Ibuprofen/Toradol prn. Although cycles are a little longer, but longer days are lighter. Patient notes this is tolerable. Can continue management.  - HTN currently managed by PCP. Much improved today. May have been worsened due to patient's chronic pain at the time.  - Patient can f/u in 3 months for annual exam.    , MD [facility]

## 2020-04-11 NOTE — Progress Notes (Deleted)
Pt present for annual exam.  

## 2020-04-12 ENCOUNTER — Encounter: Payer: Medicare HMO | Admitting: Obstetrics and Gynecology

## 2020-04-12 DIAGNOSIS — Z01419 Encounter for gynecological examination (general) (routine) without abnormal findings: Secondary | ICD-10-CM

## 2021-04-04 DIAGNOSIS — Z0000 Encounter for general adult medical examination without abnormal findings: Secondary | ICD-10-CM | POA: Diagnosis not present

## 2021-08-30 DIAGNOSIS — I10 Essential (primary) hypertension: Secondary | ICD-10-CM | POA: Diagnosis not present

## 2021-08-30 DIAGNOSIS — H6501 Acute serous otitis media, right ear: Secondary | ICD-10-CM | POA: Diagnosis not present

## 2021-10-30 DIAGNOSIS — F411 Generalized anxiety disorder: Secondary | ICD-10-CM | POA: Diagnosis not present

## 2021-10-30 DIAGNOSIS — F3130 Bipolar disorder, current episode depressed, mild or moderate severity, unspecified: Secondary | ICD-10-CM | POA: Diagnosis not present

## 2021-11-10 DIAGNOSIS — F3131 Bipolar disorder, current episode depressed, mild: Secondary | ICD-10-CM | POA: Diagnosis not present

## 2022-08-31 DIAGNOSIS — I10 Essential (primary) hypertension: Secondary | ICD-10-CM | POA: Diagnosis not present

## 2022-08-31 DIAGNOSIS — F319 Bipolar disorder, unspecified: Secondary | ICD-10-CM | POA: Diagnosis not present

## 2022-08-31 DIAGNOSIS — F17200 Nicotine dependence, unspecified, uncomplicated: Secondary | ICD-10-CM | POA: Diagnosis not present

## 2022-08-31 DIAGNOSIS — J309 Allergic rhinitis, unspecified: Secondary | ICD-10-CM | POA: Diagnosis not present

## 2022-08-31 DIAGNOSIS — H6983 Other specified disorders of Eustachian tube, bilateral: Secondary | ICD-10-CM | POA: Diagnosis not present

## 2022-10-18 DIAGNOSIS — S161XXA Strain of muscle, fascia and tendon at neck level, initial encounter: Secondary | ICD-10-CM | POA: Diagnosis not present

## 2022-10-18 DIAGNOSIS — M62838 Other muscle spasm: Secondary | ICD-10-CM | POA: Diagnosis not present

## 2023-07-09 DIAGNOSIS — N920 Excessive and frequent menstruation with regular cycle: Secondary | ICD-10-CM | POA: Diagnosis not present

## 2023-07-09 DIAGNOSIS — Z20822 Contact with and (suspected) exposure to covid-19: Secondary | ICD-10-CM | POA: Diagnosis not present

## 2023-07-09 DIAGNOSIS — Z882 Allergy status to sulfonamides status: Secondary | ICD-10-CM | POA: Diagnosis not present

## 2023-07-09 DIAGNOSIS — R0602 Shortness of breath: Secondary | ICD-10-CM | POA: Diagnosis not present

## 2023-07-09 DIAGNOSIS — Z79899 Other long term (current) drug therapy: Secondary | ICD-10-CM | POA: Diagnosis not present

## 2023-07-09 DIAGNOSIS — D509 Iron deficiency anemia, unspecified: Secondary | ICD-10-CM | POA: Diagnosis not present

## 2023-07-09 DIAGNOSIS — R002 Palpitations: Secondary | ICD-10-CM | POA: Diagnosis not present

## 2023-07-09 DIAGNOSIS — I10 Essential (primary) hypertension: Secondary | ICD-10-CM | POA: Diagnosis not present

## 2023-07-09 DIAGNOSIS — R0600 Dyspnea, unspecified: Secondary | ICD-10-CM | POA: Diagnosis not present

## 2023-07-10 DIAGNOSIS — F17210 Nicotine dependence, cigarettes, uncomplicated: Secondary | ICD-10-CM | POA: Diagnosis not present

## 2023-07-10 DIAGNOSIS — R002 Palpitations: Secondary | ICD-10-CM | POA: Diagnosis not present

## 2023-07-10 DIAGNOSIS — R0600 Dyspnea, unspecified: Secondary | ICD-10-CM | POA: Diagnosis not present

## 2023-07-10 DIAGNOSIS — F314 Bipolar disorder, current episode depressed, severe, without psychotic features: Secondary | ICD-10-CM | POA: Diagnosis not present

## 2023-07-10 DIAGNOSIS — D509 Iron deficiency anemia, unspecified: Secondary | ICD-10-CM | POA: Diagnosis not present

## 2023-07-10 DIAGNOSIS — I10 Essential (primary) hypertension: Secondary | ICD-10-CM | POA: Diagnosis not present

## 2023-07-10 DIAGNOSIS — F411 Generalized anxiety disorder: Secondary | ICD-10-CM | POA: Diagnosis not present

## 2023-07-11 ENCOUNTER — Other Ambulatory Visit: Payer: Self-pay

## 2023-07-11 ENCOUNTER — Emergency Department
Admission: EM | Admit: 2023-07-11 | Discharge: 2023-07-11 | Disposition: A | Discharge status: Home / Self Care | Payer: Medicare HMO | Attending: Emergency Medicine | Admitting: Emergency Medicine

## 2023-07-11 DIAGNOSIS — E876 Hypokalemia: Secondary | ICD-10-CM | POA: Diagnosis not present

## 2023-07-11 DIAGNOSIS — I10 Essential (primary) hypertension: Secondary | ICD-10-CM | POA: Insufficient documentation

## 2023-07-11 LAB — BASIC METABOLIC PANEL
Anion gap: 13 (ref 5–15)
BUN: 11 mg/dL (ref 6–20)
CO2: 21 mmol/L — ABNORMAL LOW (ref 22–32)
Calcium: 8.7 mg/dL — ABNORMAL LOW (ref 8.9–10.3)
Chloride: 104 mmol/L (ref 98–111)
Creatinine, Ser: 0.47 mg/dL (ref 0.44–1.00)
GFR, Estimated: >60 mL/min (ref >60)
Glucose, Bld: 127 mg/dL — ABNORMAL HIGH (ref 70–99)
Potassium: 3.2 mmol/L — ABNORMAL LOW (ref 3.5–5.1)
Sodium: 138 mmol/L (ref 135–145)

## 2023-07-11 LAB — CBC WITH DIFFERENTIAL/PLATELET
Abs Immature Granulocytes: 0.03 10*3/uL (ref 0.00–0.07)
Basophils Absolute: 0 10*3/uL (ref 0.0–0.1)
Basophils Relative: 0 %
Eosinophils Absolute: 0 10*3/uL (ref 0.0–0.5)
Eosinophils Relative: 0 %
HCT: 37.1 % (ref 36.0–46.0)
Hemoglobin: 11.4 g/dL — ABNORMAL LOW (ref 12.0–15.0)
Immature Granulocytes: 0 %
Lymphocytes Relative: 20 %
Lymphs Abs: 1.7 10*3/uL (ref 0.7–4.0)
MCH: 24.1 pg — ABNORMAL LOW (ref 26.0–34.0)
MCHC: 30.7 g/dL (ref 30.0–36.0)
MCV: 78.3 fL — ABNORMAL LOW (ref 80.0–100.0)
Monocytes Absolute: 0.5 10*3/uL (ref 0.1–1.0)
Monocytes Relative: 6 %
Neutro Abs: 6.1 10*3/uL (ref 1.7–7.7)
Neutrophils Relative %: 74 %
Platelets: 231 10*3/uL (ref 150–400)
RBC: 4.74 MIL/uL (ref 3.87–5.11)
RDW: 19.9 % — ABNORMAL HIGH (ref 11.5–15.5)
WBC: 8.4 10*3/uL (ref 4.0–10.5)
nRBC: 0 % (ref 0.0–0.2)

## 2023-07-11 LAB — MAGNESIUM: Magnesium: 2.3 mg/dL (ref 1.7–2.4)

## 2023-07-11 LAB — TSH: TSH: 0.998 u[IU]/mL (ref 0.350–4.500)

## 2023-07-11 LAB — D-DIMER, QUANTITATIVE: D-Dimer, Quant: 0.39 ug/mL-FEU (ref 0.00–0.50)

## 2023-07-11 LAB — TROPONIN I (HIGH SENSITIVITY): Troponin I (High Sensitivity): 5 ng/L (ref <18)

## 2023-07-11 MED ORDER — POTASSIUM CHLORIDE CRYS ER 20 MEQ PO TBCR
40.0000 meq | EXTENDED_RELEASE_TABLET | Freq: Once | ORAL | Status: AC
  Administered 2023-07-11: 40 meq via ORAL
  Filled 2023-07-11: qty 2

## 2023-07-11 NOTE — ED Triage Notes (Signed)
Checked blood pressure at [facility] and was 183/121, headache, and palpitation. Seen at [facility] 2 days ago for same and prescribed hydrochlorazide, seen at PCP yesterday and given metoprolol.

## 2023-07-11 NOTE — ED Provider Notes (Signed)
[facility] Provider Note    Event Date/Time   First MD Initiated Contact with Patient 07/11/23 1157     (approximate)   History   Hypertension (Checked blood pressure at [facility] and was 183/121, headache, and palpitation. Seen at [facility] 2 days ago for same and prescribed hydrochlorazide, seen at PCP yesterday and given metoprolol.  )   HPI  Anna Harris is a 42 y.o. female past medical history significant for hypertension, hyperlipidemia, who presents to the emergency department for elevated blood pressure.  Patient was recently evaluated at an urgent care and had an elevated blood pressure.  Was started on hydrochlorothiazide.  Followed up with her primary care physician yesterday and started on metoprolol.  States today she had a headache and checked her blood pressure and it was still elevated so she came into the emergency department.  Endorsing heart palpitations.  Intermittent episodes of shortness of breath and chest pain.  Concerned she may have POTS.     Physical Exam   Triage Vital Signs: ED Triage Vitals  Encounter Vitals Group     BP 07/11/23 1039 (!) 164/110     Systolic BP Percentile --      Diastolic BP Percentile --      Pulse Rate 07/11/23 1039 71     Resp 07/11/23 1039 16     Temp 07/11/23 1039 98.7 F (37.1 C)     Temp Source 07/11/23 1039 Oral     SpO2 07/11/23 1039 97 %     Weight 07/11/23 1047 187 lb (84.8 kg)     Height 07/11/23 1047 5\' 5"  (1.651 m)     Head Circumference --      Peak Flow --      Pain Score 07/11/23 1046 6     Pain Loc --      Pain Education --      Exclude from Growth Chart --     Most recent vital signs: Vitals:   07/11/23 1315 07/11/23 1400  BP: (!) 161/100 (!) 160/94  Pulse: 70 63  Resp:    Temp:    SpO2: 100% 99%    Physical Exam Constitutional:      Appearance: She is well-developed.  HENT:     Head: Atraumatic.  Eyes:     Conjunctiva/sclera: Conjunctivae normal.  Cardiovascular:      Rate and Rhythm: Regular rhythm.  Pulmonary:     Effort: No respiratory distress.  Abdominal:     General: There is no distension.  Musculoskeletal:        General: Normal range of motion.     Cervical back: Normal range of motion.  Skin:    General: Skin is warm.     Capillary Refill: Capillary refill takes less than 2 seconds.  Neurological:     Mental Status: She is alert. Mental status is at baseline.     IMPRESSION / MDM / ASSESSMENT AND PLAN / ED COURSE  I reviewed the triage vital signs and the nursing notes.  Differential diagnosis including ACS, pneumonia, hypertensive emergency, pulmonary embolism, hyperthyroidism  Low risk Wells criteria will add on a D-dimer  EKG  I, Corena Herter, the attending physician, personally viewed and interpreted this ECG.   Rate: Normal  Rhythm: Normal sinus  Axis: Normal  Intervals: Normal  ST&T Change: None  No tachycardic or bradycardic dysrhythmias while on cardiac telemetry.    LABS (all labs ordered are listed, but only abnormal results are  displayed) Labs interpreted as -    Labs Reviewed  CBC WITH DIFFERENTIAL/PLATELET - Abnormal; Notable for the following components:      Result Value   Hemoglobin 11.4 (*)    MCV 78.3 (*)    MCH 24.1 (*)    RDW 19.9 (*)    All other components within normal limits  BASIC METABOLIC PANEL - Abnormal; Notable for the following components:   Potassium 3.2 (*)    CO2 21 (*)    Glucose, Bld 127 (*)    Calcium 8.7 (*)    All other components within normal limits  D-DIMER, QUANTITATIVE  TSH  MAGNESIUM  TROPONIN I (HIGH SENSITIVITY)     MDM     No significant leukocytosis.  Troponin negative.  Mild hypokalemia, given oral replacement.  Low risk Wells criteria negative D-dimer, do not feel that CTA is necessary at this time.  Thyroid studies within normal limits.  Discussed at length hypertension and the need to take her antihypertensive medications and to follow-up with her  primary care physician.  Given return precautions.  No questions at time of discharge.  Discussed foods that would increase potassium.  PROCEDURES:  Critical Care performed: No  Procedures  Patient's presentation is most consistent with exacerbation of chronic illness.   MEDICATIONS ORDERED IN ED: Medications  potassium chloride SA (KLOR-CON M) CR tablet 40 mEq (40 mEq Oral Given 07/11/23 1412)    FINAL CLINICAL IMPRESSION(S) / ED DIAGNOSES   Final diagnoses:  Uncontrolled hypertension  Hypokalemia     Rx / DC Orders   ED Discharge Orders     None        Note:  This document was prepared using Dragon voice recognition software and may include unintentional dictation errors.   Corena Herter, MD 07/11/23 1539

## 2023-07-11 NOTE — Discharge Instructions (Addendum)
Your potassium was mildly low when checked today.  Make sure to follow up with a primary doctor to follow up your labs.  Make sure to eat food high in potassium and magnesium - examples - potatoes, spinach, bananas, beans, avocadoes, oranges, nuts. 

## 2023-07-12 DIAGNOSIS — I10 Essential (primary) hypertension: Secondary | ICD-10-CM | POA: Diagnosis not present

## 2023-07-17 ENCOUNTER — Telehealth: Payer: Self-pay

## 2023-07-17 DIAGNOSIS — R5383 Other fatigue: Secondary | ICD-10-CM | POA: Diagnosis not present

## 2023-07-17 DIAGNOSIS — E876 Hypokalemia: Secondary | ICD-10-CM | POA: Diagnosis not present

## 2023-07-17 DIAGNOSIS — F319 Bipolar disorder, unspecified: Secondary | ICD-10-CM | POA: Diagnosis not present

## 2023-07-17 DIAGNOSIS — R5381 Other malaise: Secondary | ICD-10-CM | POA: Diagnosis not present

## 2023-07-17 DIAGNOSIS — I10 Essential (primary) hypertension: Secondary | ICD-10-CM | POA: Diagnosis not present

## 2023-07-17 DIAGNOSIS — R232 Flushing: Secondary | ICD-10-CM | POA: Diagnosis not present

## 2023-07-17 DIAGNOSIS — D509 Iron deficiency anemia, unspecified: Secondary | ICD-10-CM | POA: Diagnosis not present

## 2023-07-17 NOTE — Telephone Encounter (Signed)
Transition Care Management Unsuccessful Follow-up Telephone Call  Date of discharge and from where:  07/11/2023 [facility]  Attempts:  1st Attempt  Reason for unsuccessful TCM follow-up call:  Left voice message  Erryn Dickison [facility]  [facility] Population Health Community Resource Care Guide   [email or web address]  ?? [phone]   Website: [email or web address]  [email or web address]

## 2023-07-18 ENCOUNTER — Telehealth: Payer: Self-pay

## 2023-07-18 NOTE — Telephone Encounter (Signed)
Transition Care Management Unsuccessful Follow-up Telephone Call  Date of discharge and from where:  07/11/2023 [facility]  Attempts:  2nd Attempt  Reason for unsuccessful TCM follow-up call:  Left voice message  Anna Harris [facility]  [facility] Population Health Community Resource Care Guide   [email or web address]  ?? [phone]   Website: [email or web address]

## 2023-09-12 DIAGNOSIS — M791 Myalgia, unspecified site: Secondary | ICD-10-CM | POA: Diagnosis not present

## 2023-09-12 DIAGNOSIS — I10 Essential (primary) hypertension: Secondary | ICD-10-CM | POA: Diagnosis not present

## 2023-09-12 DIAGNOSIS — Z0000 Encounter for general adult medical examination without abnormal findings: Secondary | ICD-10-CM | POA: Diagnosis not present

## 2023-09-12 DIAGNOSIS — Z1331 Encounter for screening for depression: Secondary | ICD-10-CM | POA: Diagnosis not present

## 2023-09-12 DIAGNOSIS — E785 Hyperlipidemia, unspecified: Secondary | ICD-10-CM | POA: Diagnosis not present

## 2023-09-12 DIAGNOSIS — F314 Bipolar disorder, current episode depressed, severe, without psychotic features: Secondary | ICD-10-CM | POA: Diagnosis not present

## 2023-10-11 DIAGNOSIS — K047 Periapical abscess without sinus: Secondary | ICD-10-CM | POA: Diagnosis not present

## 2023-10-11 DIAGNOSIS — K0889 Other specified disorders of teeth and supporting structures: Secondary | ICD-10-CM | POA: Diagnosis not present

## 2024-01-09 DIAGNOSIS — F17200 Nicotine dependence, unspecified, uncomplicated: Secondary | ICD-10-CM | POA: Diagnosis not present

## 2024-01-09 DIAGNOSIS — E785 Hyperlipidemia, unspecified: Secondary | ICD-10-CM | POA: Diagnosis not present

## 2024-01-09 DIAGNOSIS — F314 Bipolar disorder, current episode depressed, severe, without psychotic features: Secondary | ICD-10-CM | POA: Diagnosis not present

## 2024-01-09 DIAGNOSIS — N946 Dysmenorrhea, unspecified: Secondary | ICD-10-CM | POA: Diagnosis not present

## 2024-01-09 DIAGNOSIS — I10 Essential (primary) hypertension: Secondary | ICD-10-CM | POA: Diagnosis not present

## 2024-01-18 ENCOUNTER — Other Ambulatory Visit: Payer: Self-pay

## 2024-01-18 ENCOUNTER — Emergency Department
Admission: EM | Admit: 2024-01-18 | Discharge: 2024-01-18 | Disposition: A | Discharge status: Home / Self Care | Payer: Medicaid Other | Attending: Emergency Medicine | Admitting: Emergency Medicine

## 2024-01-18 DIAGNOSIS — K0889 Other specified disorders of teeth and supporting structures: Secondary | ICD-10-CM | POA: Diagnosis not present

## 2024-01-18 DIAGNOSIS — Z5321 Procedure and treatment not carried out due to patient leaving prior to being seen by health care provider: Secondary | ICD-10-CM | POA: Insufficient documentation

## 2024-01-18 NOTE — ED Triage Notes (Signed)
 Pt to ED for dental pain since 2 days ago to R upper jaw, known carie with "hole" where cavity fell out. Asking for abx. Took 500mg  tylenol  just PTA.

## 2024-02-03 DIAGNOSIS — N946 Dysmenorrhea, unspecified: Secondary | ICD-10-CM | POA: Diagnosis not present

## 2024-02-03 DIAGNOSIS — F5222 Female sexual arousal disorder: Secondary | ICD-10-CM | POA: Diagnosis not present

## 2024-08-12 ENCOUNTER — Emergency Department
Admission: EM | Admit: 2024-08-12 | Discharge: 2024-08-12 | Disposition: A | Discharge status: Home / Self Care | Attending: Emergency Medicine | Admitting: Emergency Medicine

## 2024-08-12 ENCOUNTER — Other Ambulatory Visit: Payer: Self-pay

## 2024-08-12 DIAGNOSIS — K0889 Other specified disorders of teeth and supporting structures: Secondary | ICD-10-CM | POA: Insufficient documentation

## 2024-08-12 DIAGNOSIS — I10 Essential (primary) hypertension: Secondary | ICD-10-CM | POA: Diagnosis not present

## 2024-08-12 MED ORDER — CHLORHEXIDINE GLUCONATE 0.12 % MT SOLN: 15.0000 mL | Freq: Two times a day (BID) | OROMUCOSAL | 0 refills | Status: AC

## 2024-08-12 MED ORDER — AMOXICILLIN-POT CLAVULANATE 875-125 MG PO TABS: 1.0000 | ORAL_TABLET | Freq: Two times a day (BID) | ORAL | 0 refills | Status: AC

## 2024-08-12 NOTE — ED Triage Notes (Signed)
 Patient states left upper dental pain that started about 1-2 days ago; does not have a dentist.

## 2024-08-12 NOTE — Discharge Instructions (Signed)
 Please follow-up with a dentist as soon as possible.  Take the antibiotics and use the mouth rinse as prescribed.  Please return for any new, worsening, or changing symptoms or other concerns.  It was a pleasure caring for you today.

## 2024-08-12 NOTE — ED Provider Notes (Signed)
 [facility] Provider Note    Event Date/Time   First MD Initiated Contact with Patient 08/12/24 [phone]     (approximate)   History   Dental Pain   HPI  Anna Harris is a 43 y.o. female who presents today for evaluation of dental pain.  Patient reports that she had a cavity in her tooth which stripped off and now she has pain.  She reports that the tooth chipped approximately 1 month ago.  She has not had any facial or neck swelling or erythema.  No voice change.  No difficulty breathing or swallowing.  She does not yet have a dentist.  Patient Active Problem List   Diagnosis Date Noted   HAV (hallux abducto valgus) 03/08/2016   Bipolar 1 disorder, manic, moderate (HCC) 04/12/2015   Tobacco use disorder 04/12/2015   Accelerated hypertension 04/12/2015   Cannabis abuse 04/12/2015   Bipolar 1 disorder, depressed, severe (HCC) 04/11/2015          Physical Exam   Triage Vital Signs: ED Triage Vitals  Encounter Vitals Group     BP 08/12/24 0723 (!) 181/103     Girls Systolic BP Percentile --      Girls Diastolic BP Percentile --      Boys Systolic BP Percentile --      Boys Diastolic BP Percentile --      Pulse Rate 08/12/24 0723 90     Resp 08/12/24 0723 18     Temp 08/12/24 0723 98.2 F (36.8 C)     Temp Source 08/12/24 0723 Oral     SpO2 08/12/24 0723 93 %     Weight 08/12/24 0724 230 lb (104.3 kg)     Height 08/12/24 0724 5' 5 (1.651 m)     Head Circumference --      Peak Flow --      Pain Score 08/12/24 0724 9     Pain Loc --      Pain Education --      Exclude from Growth Chart --     Most recent vital signs: Vitals:   08/12/24 0723  BP: (!) 181/103  Pulse: 90  Resp: 18  Temp: 98.2 F (36.8 C)  SpO2: 93%    Physical Exam Vitals and nursing note reviewed.  Constitutional:      General: Awake and alert. No acute distress.    Appearance: Normal appearance. The patient is normal weight.  HENT:     Head: Normocephalic  and atraumatic.     Mouth: Mucous membranes are moist.  Small chip off of tooth #13.  No gingival fluctuance or erythema.  No sublingual swelling.  Numerous dental caries noted.  No facial or neck swelling or erythema. Eyes:     General: PERRL. Normal EOMs        Right eye: No discharge.        Left eye: No discharge.     Conjunctiva/sclera: Conjunctivae normal.  Cardiovascular:     Rate and Rhythm: Normal rate and regular rhythm.     Pulses: Normal pulses.  Pulmonary:     Effort: Pulmonary effort is normal. No respiratory distress.     Breath sounds: Normal breath sounds.  Abdominal:     Abdomen is soft.  Musculoskeletal:        General: No swelling. Normal range of motion.     Cervical back: Normal range of motion and neck supple.  Skin:    General: Skin is  warm and dry.     Capillary Refill: Capillary refill takes less than 2 seconds.     Findings: No rash.  Neurological:     Mental Status: The patient is awake and alert.      ED Results / Procedures / Treatments   Labs (all labs ordered are listed, but only abnormal results are displayed) Labs Reviewed - No data to display   EKG     RADIOLOGY     PROCEDURES:  Critical Care performed:   Procedures   MEDICATIONS ORDERED IN ED: Medications - No data to display   IMPRESSION / MDM / ASSESSMENT AND PLAN / ED COURSE  I reviewed the triage vital signs and the nursing notes.   Differential diagnosis includes, but is not limited to, broken tooth, pulpitis, abscess.  Patient was evaluated in the emergency department for dental pain. Patient has tenderness over 1 of her teeth and poor dentition, I suspect pain from dental fracture vs pulpitits. No gingival swelling or fluctuance concerning for gingival abscess.  No trismus, nuchal rigidity, neck pain, hot potato voice, uvular deviation or malocclusion to suggest deep space infection. No sublingual swelling concerning for Ludwig's angina.  Patient was started on  antibiotics and chlorhexidine  mouth rinse.  Patient was treated symptomatically in the emergency department. Discussed care plan, return precautions, and advised close outpatient follow-up with dentist.  She was given a list of dental clinics.  Patient agrees with plan of care.   Patient's presentation is most consistent with acute complicated illness / injury requiring diagnostic workup.     FINAL CLINICAL IMPRESSION(S) / ED DIAGNOSES   Final diagnoses:  Pain, dental     Rx / DC Orders   ED Discharge Orders          Ordered    amoxicillin -clavulanate (AUGMENTIN ) 875-125 MG tablet  2 times daily        08/12/24 0736    chlorhexidine  (PERIDEX ) 0.12 % solution  2 times daily        08/12/24 0736             Note:  This document was prepared using Dragon voice recognition software and may include unintentional dictation errors.   Darrie Macmillan E, PA-C 08/12/24 1035    Jacolyn Pae, MD 08/12/24 1440

## 2024-10-26 ENCOUNTER — Ambulatory Visit: Admitting: Dermatology

## 2024-10-26 ENCOUNTER — Other Ambulatory Visit: Payer: Self-pay | Admitting: Dermatology

## 2024-10-26 ENCOUNTER — Encounter: Payer: Self-pay | Admitting: Dermatology

## 2024-10-26 DIAGNOSIS — L570 Actinic keratosis: Secondary | ICD-10-CM

## 2024-10-26 DIAGNOSIS — L720 Epidermal cyst: Secondary | ICD-10-CM

## 2024-10-26 DIAGNOSIS — L859 Epidermal thickening, unspecified: Secondary | ICD-10-CM

## 2024-10-26 DIAGNOSIS — D485 Neoplasm of uncertain behavior of skin: Secondary | ICD-10-CM | POA: Diagnosis not present

## 2024-10-26 NOTE — Progress Notes (Signed)
   New Patient Visit   Subjective  Anna Harris is a 43 y.o. female who presents for the following: spot at left chin, started as a small pimple but was under the skin, itches. Patient picks at. Also a spot at right earlobe.   The following portions of the chart were reviewed this encounter and updated as appropriate: medications, allergies, medical history  Review of Systems:  No other skin or systemic complaints except as noted in HPI or Assessment and Plan.  Objective  Well appearing patient in no apparent distress; mood and affect are within normal limits.   A focused examination was performed of the following areas: Face, ears  Relevant exam findings are noted in the Assessment and Plan.  left chin 6 mm keratotic scaly papule    Assessment & Plan   EPIDERMAL INCLUSION CYST Exam: Subcutaneous nodule at right posterior earlobe 1.0 cm  Benign-appearing. Exam most consistent with an epidermal inclusion cyst. Discussed that a cyst is a benign growth that can grow over time and sometimes get irritated or inflamed. Recommend observation if it is not bothersome. Discussed option of surgical excision to remove it if it is growing, symptomatic, or other changes noted. Please call for new or changing lesions so they can be evaluated.  Referral sent to Dr. Corey.      NEOPLASM OF UNCERTAIN BEHAVIOR OF SKIN left chin Epidermal / dermal shaving  Lesion diameter (cm):  0.6 Informed consent: discussed and consent obtained   Timeout: patient name, date of birth, surgical site, and procedure verified   Procedure prep:  Patient was prepped and draped in usual sterile fashion Prep type:  Isopropyl alcohol Anesthesia: the lesion was anesthetized in a standard fashion   Anesthetic:  1% lidocaine w/ epinephrine 1-100,000 buffered w/ 8.4% NaHCO3 Instrument used: DermaBlade   Hemostasis achieved with: pressure and aluminum chloride   Outcome: patient tolerated procedure well    Post-procedure details: wound care instructions given    Specimen 1 - Surgical pathology Differential Diagnosis: Verruca vs Prurigo Nodule  Check Margins: No EIC (EPIDERMAL INCLUSION CYST)   Related Procedures Ambulatory referral to Dermatology  Return if symptoms worsen or fail to improve.  [name] Lonell Drones, RMA, am acting as scribe for Boneta Sharps, MD .   Documentation: I have reviewed the above documentation for accuracy and completeness, and I agree with the above.  Boneta Sharps, MD

## 2024-10-26 NOTE — Patient Instructions (Signed)

## 2024-10-27 LAB — DERMATOLOGY PATHOLOGY

## 2024-10-28 ENCOUNTER — Ambulatory Visit: Payer: Self-pay | Admitting: Dermatology

## 2024-10-28 ENCOUNTER — Encounter: Payer: Self-pay | Admitting: Dermatology

## 2024-10-28 ENCOUNTER — Other Ambulatory Visit: Payer: Self-pay

## 2024-10-28 DIAGNOSIS — L720 Epidermal cyst: Secondary | ICD-10-CM

## 2025-01-12 ENCOUNTER — Encounter: Admitting: Dermatology
